# Patient Record
Sex: Female | Born: 1980 | Race: White | Hispanic: No | Marital: Single | Smoking: Former smoker
Health system: Southern US, Community
[De-identification: ages and names within clinical notes are randomized; demographics above are authoritative.]

## PROBLEM LIST (undated history)

## (undated) ENCOUNTER — Inpatient Hospital Stay (HOSPITAL_COMMUNITY): Payer: Self-pay

## (undated) DIAGNOSIS — F419 Anxiety disorder, unspecified: Secondary | ICD-10-CM

## (undated) DIAGNOSIS — R87619 Unspecified abnormal cytological findings in specimens from cervix uteri: Secondary | ICD-10-CM

## (undated) DIAGNOSIS — IMO0002 Reserved for concepts with insufficient information to code with codable children: Secondary | ICD-10-CM

## (undated) DIAGNOSIS — R51 Headache: Secondary | ICD-10-CM

## (undated) DIAGNOSIS — O139 Gestational [pregnancy-induced] hypertension without significant proteinuria, unspecified trimester: Secondary | ICD-10-CM

## (undated) DIAGNOSIS — R87629 Unspecified abnormal cytological findings in specimens from vagina: Secondary | ICD-10-CM

## (undated) HISTORY — PX: UPPER GASTROINTESTINAL ENDOSCOPY: SHX188

## (undated) HISTORY — DX: Unspecified abnormal cytological findings in specimens from vagina: R87.629

## (undated) HISTORY — PX: COLONOSCOPY: SHX174

## (undated) HISTORY — PX: OTHER SURGICAL HISTORY: SHX169

## (undated) HISTORY — PX: WISDOM TOOTH EXTRACTION: SHX21

## (undated) SURGERY — Surgical Case
Anesthesia: *Unknown

---
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
## 2002-06-04 ENCOUNTER — Emergency Department (HOSPITAL_COMMUNITY): Admission: EM | Admit: 2002-06-04 | Discharge: 2002-06-04 | Payer: Self-pay | Admitting: Emergency Medicine

## 2002-06-06 ENCOUNTER — Emergency Department (HOSPITAL_COMMUNITY): Admission: EM | Admit: 2002-06-06 | Discharge: 2002-06-06 | Payer: Self-pay | Admitting: Emergency Medicine

## 2002-09-09 HISTORY — PX: CHOLECYSTECTOMY: SHX55

## 2002-10-25 ENCOUNTER — Emergency Department (HOSPITAL_COMMUNITY): Admission: EM | Admit: 2002-10-25 | Discharge: 2002-10-25 | Payer: Self-pay | Admitting: Emergency Medicine

## 2002-10-26 ENCOUNTER — Encounter: Payer: Self-pay | Admitting: Emergency Medicine

## 2002-10-27 ENCOUNTER — Observation Stay (HOSPITAL_COMMUNITY): Admission: RE | Admit: 2002-10-27 | Discharge: 2002-10-28 | Payer: Self-pay | Admitting: General Surgery

## 2002-10-28 ENCOUNTER — Encounter (INDEPENDENT_AMBULATORY_CARE_PROVIDER_SITE_OTHER): Payer: Self-pay | Admitting: Specialist

## 2002-12-26 ENCOUNTER — Emergency Department (HOSPITAL_COMMUNITY): Admission: EM | Admit: 2002-12-26 | Discharge: 2002-12-26 | Payer: Self-pay

## 2003-02-25 ENCOUNTER — Other Ambulatory Visit: Admission: RE | Admit: 2003-02-25 | Discharge: 2003-02-25 | Payer: Self-pay | Admitting: Obstetrics and Gynecology

## 2004-02-28 ENCOUNTER — Other Ambulatory Visit: Admission: RE | Admit: 2004-02-28 | Discharge: 2004-02-28 | Payer: Self-pay | Admitting: Obstetrics and Gynecology

## 2005-03-12 ENCOUNTER — Emergency Department (HOSPITAL_COMMUNITY): Admission: EM | Admit: 2005-03-12 | Discharge: 2005-03-12 | Payer: Self-pay | Admitting: Emergency Medicine

## 2005-07-19 ENCOUNTER — Emergency Department (HOSPITAL_COMMUNITY): Admission: EM | Admit: 2005-07-19 | Discharge: 2005-07-20 | Payer: Self-pay | Admitting: Emergency Medicine

## 2005-10-14 ENCOUNTER — Other Ambulatory Visit: Admission: RE | Admit: 2005-10-14 | Discharge: 2005-10-14 | Payer: Self-pay | Admitting: Obstetrics and Gynecology

## 2006-01-26 ENCOUNTER — Emergency Department (HOSPITAL_COMMUNITY): Admission: EM | Admit: 2006-01-26 | Discharge: 2006-01-26 | Payer: Self-pay | Admitting: Emergency Medicine

## 2006-06-17 ENCOUNTER — Emergency Department (HOSPITAL_COMMUNITY): Admission: EM | Admit: 2006-06-17 | Discharge: 2006-06-17 | Payer: Self-pay | Admitting: Emergency Medicine

## 2006-12-08 ENCOUNTER — Emergency Department (HOSPITAL_COMMUNITY): Admission: EM | Admit: 2006-12-08 | Discharge: 2006-12-08 | Payer: Self-pay | Admitting: Emergency Medicine

## 2007-04-06 ENCOUNTER — Emergency Department (HOSPITAL_COMMUNITY): Admission: EM | Admit: 2007-04-06 | Discharge: 2007-04-06 | Payer: Self-pay | Admitting: Emergency Medicine

## 2007-05-26 ENCOUNTER — Emergency Department (HOSPITAL_COMMUNITY): Admission: EM | Admit: 2007-05-26 | Discharge: 2007-05-26 | Payer: Self-pay | Admitting: Emergency Medicine

## 2007-06-01 ENCOUNTER — Emergency Department (HOSPITAL_COMMUNITY): Admission: EM | Admit: 2007-06-01 | Discharge: 2007-06-01 | Payer: Self-pay | Admitting: Emergency Medicine

## 2007-06-23 ENCOUNTER — Emergency Department (HOSPITAL_COMMUNITY): Admission: EM | Admit: 2007-06-23 | Discharge: 2007-06-23 | Payer: Self-pay | Admitting: Emergency Medicine

## 2007-06-26 ENCOUNTER — Emergency Department (HOSPITAL_COMMUNITY): Admission: EM | Admit: 2007-06-26 | Discharge: 2007-06-26 | Payer: Self-pay | Admitting: Emergency Medicine

## 2007-07-13 ENCOUNTER — Emergency Department (HOSPITAL_COMMUNITY): Admission: EM | Admit: 2007-07-13 | Discharge: 2007-07-13 | Payer: Self-pay | Admitting: *Deleted

## 2007-07-27 ENCOUNTER — Emergency Department (HOSPITAL_COMMUNITY): Admission: EM | Admit: 2007-07-27 | Discharge: 2007-07-27 | Payer: Self-pay | Admitting: Emergency Medicine

## 2007-08-29 ENCOUNTER — Emergency Department (HOSPITAL_COMMUNITY): Admission: EM | Admit: 2007-08-29 | Discharge: 2007-08-29 | Payer: Self-pay | Admitting: Emergency Medicine

## 2007-09-02 ENCOUNTER — Emergency Department (HOSPITAL_COMMUNITY): Admission: EM | Admit: 2007-09-02 | Discharge: 2007-09-02 | Payer: Self-pay | Admitting: Emergency Medicine

## 2007-09-27 ENCOUNTER — Emergency Department (HOSPITAL_COMMUNITY): Admission: EM | Admit: 2007-09-27 | Discharge: 2007-09-27 | Payer: Self-pay | Admitting: Emergency Medicine

## 2007-10-13 ENCOUNTER — Emergency Department (HOSPITAL_COMMUNITY): Admission: EM | Admit: 2007-10-13 | Discharge: 2007-10-13 | Payer: Self-pay | Admitting: Emergency Medicine

## 2007-11-20 ENCOUNTER — Emergency Department (HOSPITAL_COMMUNITY): Admission: EM | Admit: 2007-11-20 | Discharge: 2007-11-20 | Payer: Self-pay | Admitting: Emergency Medicine

## 2008-01-14 ENCOUNTER — Emergency Department (HOSPITAL_COMMUNITY): Admission: EM | Admit: 2008-01-14 | Discharge: 2008-01-14 | Payer: Self-pay | Admitting: Emergency Medicine

## 2008-02-14 ENCOUNTER — Emergency Department (HOSPITAL_COMMUNITY): Admission: EM | Admit: 2008-02-14 | Discharge: 2008-02-15 | Payer: Self-pay | Admitting: Emergency Medicine

## 2008-04-04 ENCOUNTER — Inpatient Hospital Stay (HOSPITAL_COMMUNITY): Admission: AD | Admit: 2008-04-04 | Discharge: 2008-04-04 | Payer: Self-pay | Admitting: Obstetrics and Gynecology

## 2008-05-04 DIAGNOSIS — R87612 Low grade squamous intraepithelial lesion on cytologic smear of cervix (LGSIL): Secondary | ICD-10-CM | POA: Insufficient documentation

## 2008-05-12 ENCOUNTER — Emergency Department (HOSPITAL_COMMUNITY): Admission: EM | Admit: 2008-05-12 | Discharge: 2008-05-12 | Payer: Self-pay | Admitting: Emergency Medicine

## 2008-06-21 ENCOUNTER — Ambulatory Visit: Admission: RE | Admit: 2008-06-21 | Discharge: 2008-06-21 | Payer: Self-pay | Admitting: Gynecologic Oncology

## 2008-07-06 ENCOUNTER — Inpatient Hospital Stay (HOSPITAL_COMMUNITY): Admission: AD | Admit: 2008-07-06 | Discharge: 2008-07-07 | Payer: Self-pay | Admitting: Obstetrics and Gynecology

## 2008-07-15 ENCOUNTER — Inpatient Hospital Stay (HOSPITAL_COMMUNITY): Admission: AD | Admit: 2008-07-15 | Discharge: 2008-07-15 | Payer: Self-pay | Admitting: Obstetrics and Gynecology

## 2008-07-19 ENCOUNTER — Encounter: Admission: RE | Admit: 2008-07-19 | Discharge: 2008-07-19 | Payer: Self-pay | Admitting: Obstetrics and Gynecology

## 2008-08-05 ENCOUNTER — Inpatient Hospital Stay (HOSPITAL_COMMUNITY): Admission: AD | Admit: 2008-08-05 | Discharge: 2008-08-05 | Payer: Self-pay | Admitting: Obstetrics and Gynecology

## 2008-08-22 ENCOUNTER — Inpatient Hospital Stay (HOSPITAL_COMMUNITY): Admission: AD | Admit: 2008-08-22 | Discharge: 2008-08-22 | Payer: Self-pay | Admitting: Obstetrics and Gynecology

## 2008-09-19 ENCOUNTER — Inpatient Hospital Stay (HOSPITAL_COMMUNITY): Admission: AD | Admit: 2008-09-19 | Discharge: 2008-09-20 | Payer: Self-pay | Admitting: Obstetrics and Gynecology

## 2008-10-07 ENCOUNTER — Inpatient Hospital Stay (HOSPITAL_COMMUNITY): Admission: AD | Admit: 2008-10-07 | Discharge: 2008-10-07 | Payer: Self-pay | Admitting: Obstetrics and Gynecology

## 2008-10-18 ENCOUNTER — Inpatient Hospital Stay (HOSPITAL_COMMUNITY): Admission: AD | Admit: 2008-10-18 | Discharge: 2008-10-18 | Payer: Self-pay | Admitting: Obstetrics and Gynecology

## 2008-10-22 ENCOUNTER — Inpatient Hospital Stay (HOSPITAL_COMMUNITY): Admission: AD | Admit: 2008-10-22 | Discharge: 2008-10-22 | Payer: Self-pay | Admitting: Obstetrics and Gynecology

## 2008-10-27 ENCOUNTER — Inpatient Hospital Stay (HOSPITAL_COMMUNITY): Admission: AD | Admit: 2008-10-27 | Discharge: 2008-10-27 | Payer: Self-pay | Admitting: Obstetrics and Gynecology

## 2008-10-29 ENCOUNTER — Inpatient Hospital Stay (HOSPITAL_COMMUNITY): Admission: AD | Admit: 2008-10-29 | Discharge: 2008-10-29 | Payer: Self-pay | Admitting: Obstetrics and Gynecology

## 2008-10-31 ENCOUNTER — Inpatient Hospital Stay (HOSPITAL_COMMUNITY): Admission: AD | Admit: 2008-10-31 | Discharge: 2008-10-31 | Payer: Self-pay | Admitting: Obstetrics and Gynecology

## 2008-11-05 ENCOUNTER — Inpatient Hospital Stay (HOSPITAL_COMMUNITY): Admission: AD | Admit: 2008-11-05 | Discharge: 2008-11-05 | Payer: Self-pay | Admitting: Obstetrics and Gynecology

## 2008-11-10 ENCOUNTER — Inpatient Hospital Stay (HOSPITAL_COMMUNITY): Admission: AD | Admit: 2008-11-10 | Discharge: 2008-11-10 | Payer: Self-pay | Admitting: Obstetrics and Gynecology

## 2008-11-12 ENCOUNTER — Inpatient Hospital Stay (HOSPITAL_COMMUNITY): Admission: AD | Admit: 2008-11-12 | Discharge: 2008-11-13 | Payer: Self-pay | Admitting: Obstetrics and Gynecology

## 2008-11-14 ENCOUNTER — Inpatient Hospital Stay (HOSPITAL_COMMUNITY): Admission: AD | Admit: 2008-11-14 | Discharge: 2008-11-17 | Payer: Self-pay | Admitting: Obstetrics and Gynecology

## 2009-02-20 ENCOUNTER — Emergency Department (HOSPITAL_COMMUNITY): Admission: EM | Admit: 2009-02-20 | Discharge: 2009-02-20 | Payer: Self-pay | Admitting: Emergency Medicine

## 2009-04-09 ENCOUNTER — Emergency Department (HOSPITAL_COMMUNITY): Admission: EM | Admit: 2009-04-09 | Discharge: 2009-04-09 | Payer: Self-pay | Admitting: Emergency Medicine

## 2009-05-22 ENCOUNTER — Emergency Department (HOSPITAL_COMMUNITY): Admission: EM | Admit: 2009-05-22 | Discharge: 2009-05-23 | Payer: Self-pay | Admitting: Emergency Medicine

## 2009-06-11 ENCOUNTER — Emergency Department (HOSPITAL_COMMUNITY): Admission: EM | Admit: 2009-06-11 | Discharge: 2009-06-11 | Payer: Self-pay | Admitting: Emergency Medicine

## 2009-07-03 ENCOUNTER — Encounter: Admission: RE | Admit: 2009-07-03 | Discharge: 2009-07-03 | Payer: Self-pay | Admitting: Family Medicine

## 2009-10-28 IMAGING — CR DG FOOT COMPLETE 3+V*L*
3 series · 3 of 3 positions shown · non-contrast
Comparison: 03/12/2005

CLINICAL DATA: Injury today.  Chronic pain.  Pain centered at the
first metatarsal.

LEFT FOOT - COMPLETE 3+ VIEW

[t foot ap left]
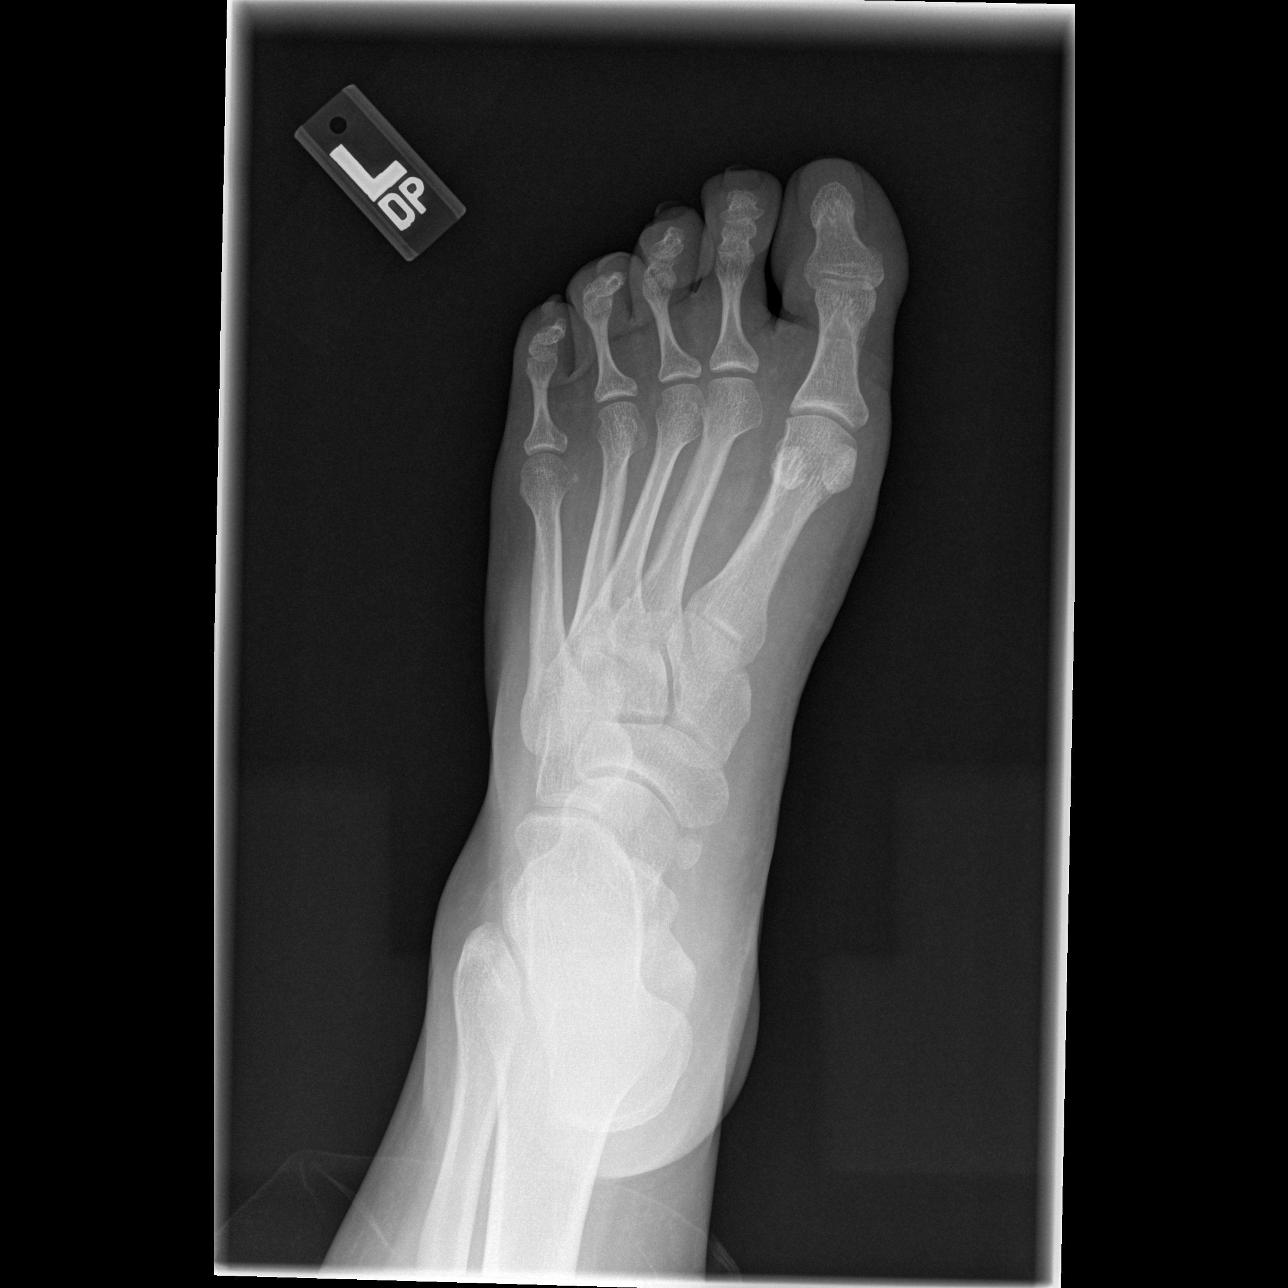

[t foot oblique left]
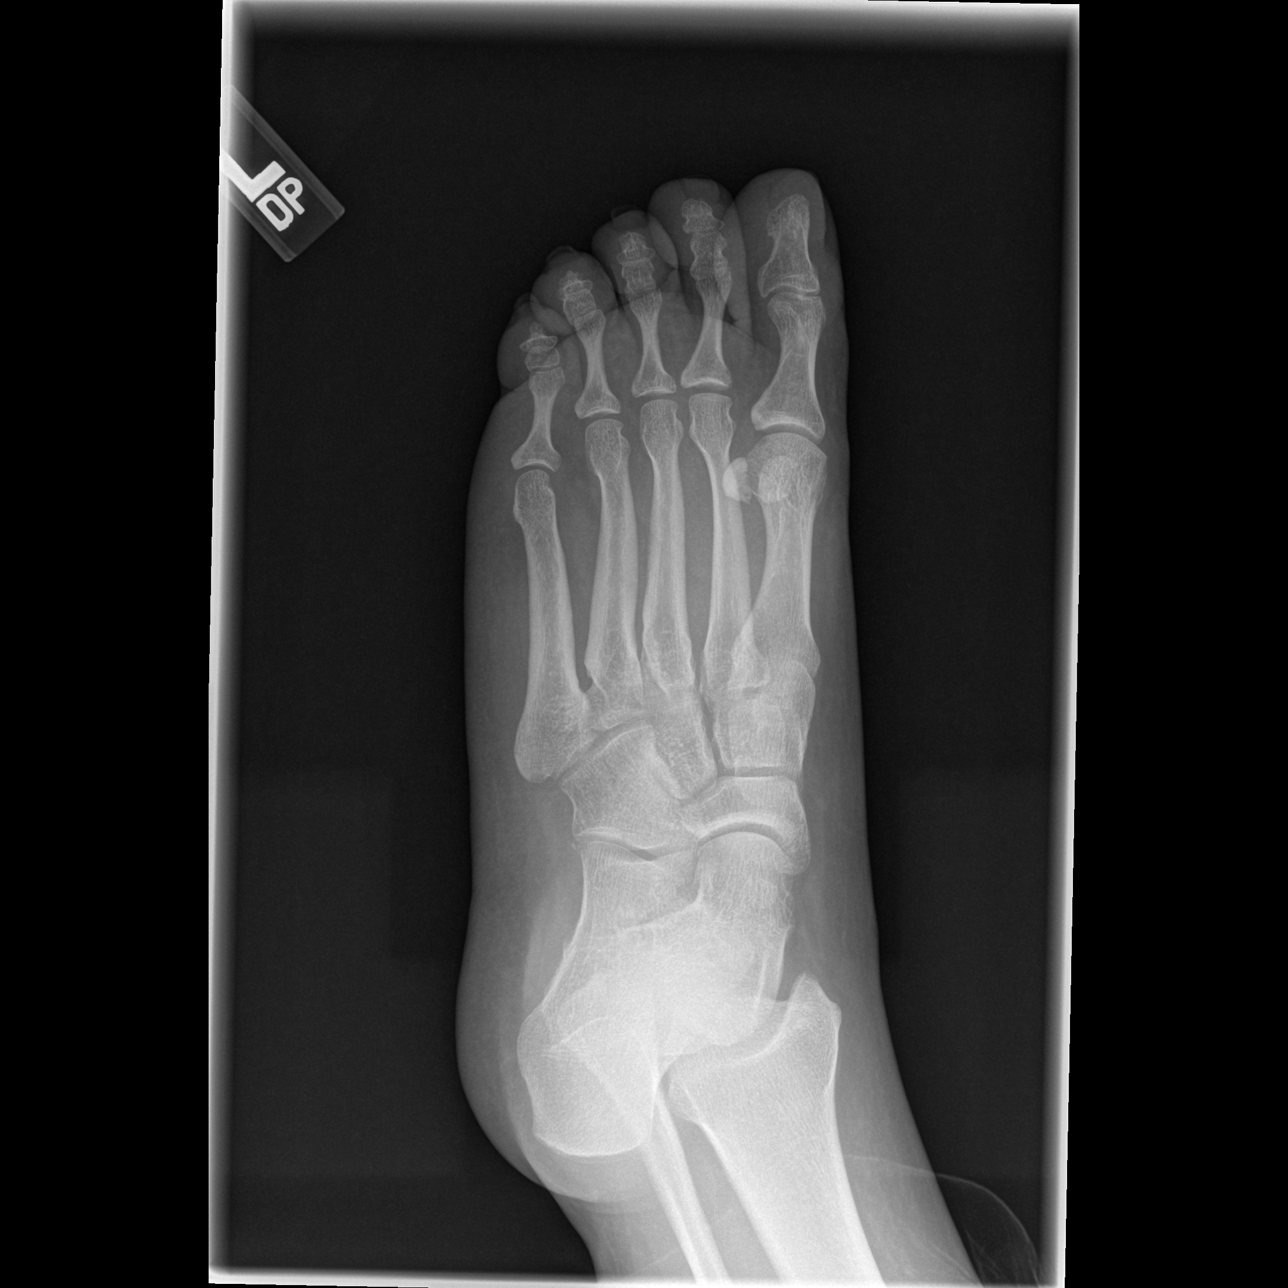

[t foot lat left]
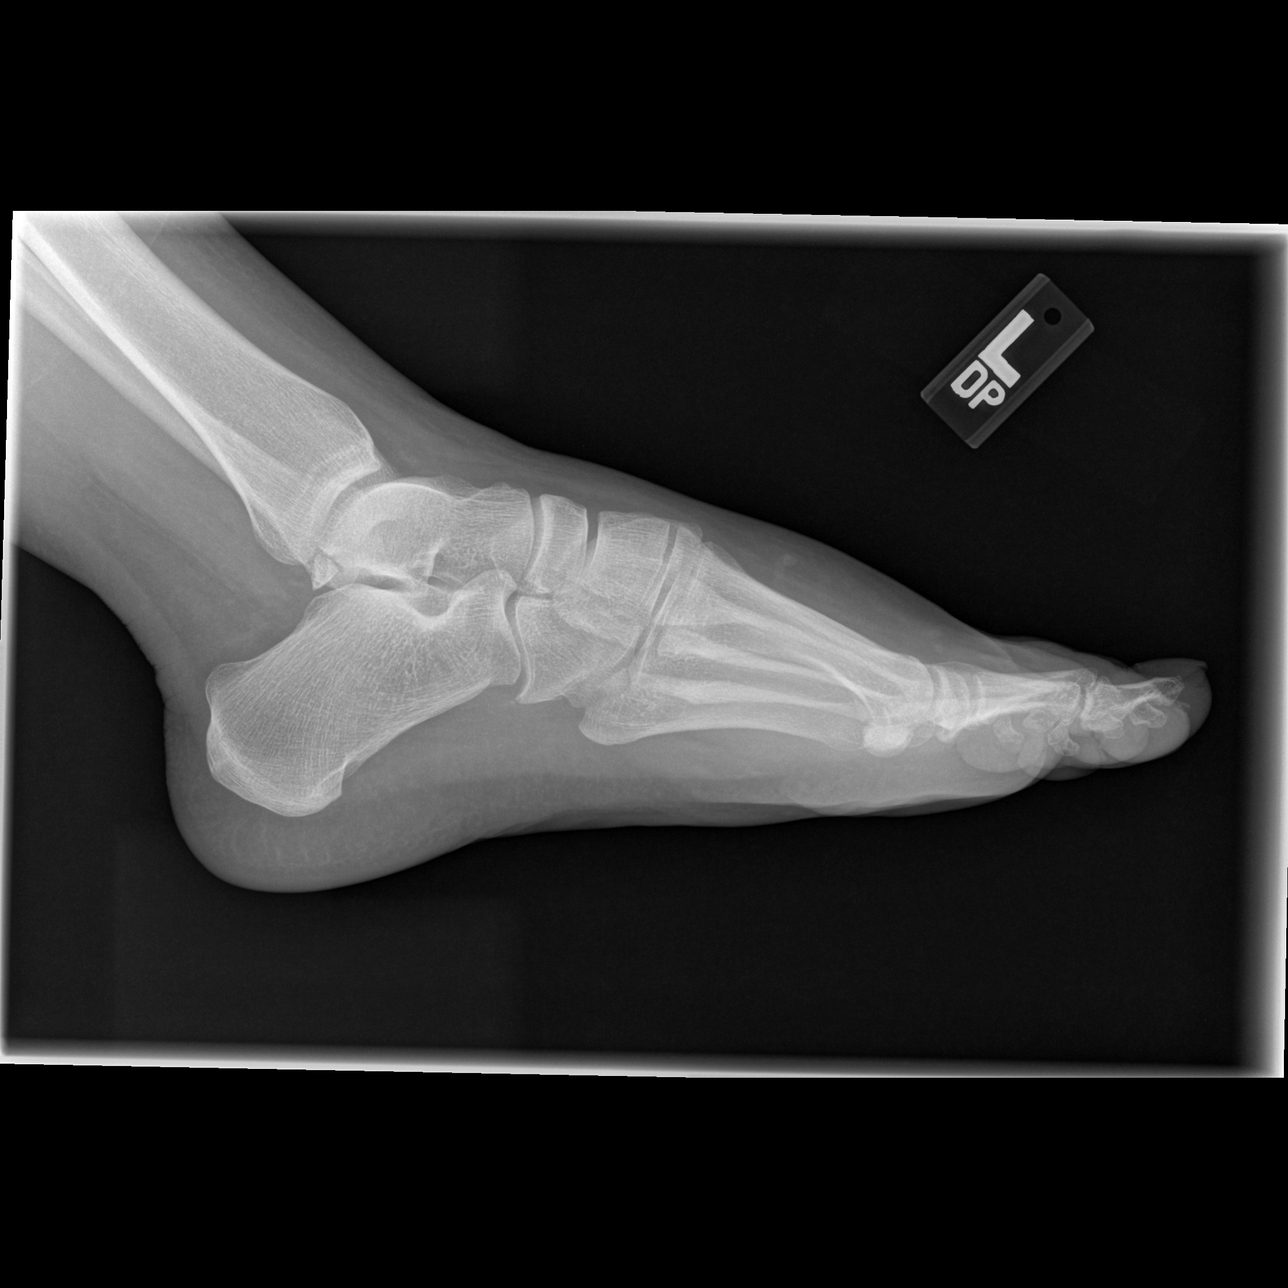

[3 of 3 positions shown; findings below may reference images not displayed]

FINDINGS: Mild dorsal forefoot soft tissue swelling suspected. No
acute fracture or dislocation.
IMPRESSION: 1. No acute fracture or dislocation about the left foot
2.  Soft tissue swelling dorsally about the midfoot and forefoot.

## 2009-11-21 ENCOUNTER — Emergency Department (HOSPITAL_COMMUNITY): Admission: EM | Admit: 2009-11-21 | Discharge: 2009-11-21 | Payer: Self-pay | Admitting: Emergency Medicine

## 2009-11-23 ENCOUNTER — Emergency Department (HOSPITAL_COMMUNITY): Admission: EM | Admit: 2009-11-23 | Discharge: 2009-11-23 | Payer: Self-pay | Admitting: Family Medicine

## 2009-11-24 ENCOUNTER — Encounter: Admission: RE | Admit: 2009-11-24 | Discharge: 2009-11-24 | Payer: Self-pay | Admitting: Family Medicine

## 2009-12-10 ENCOUNTER — Emergency Department (HOSPITAL_COMMUNITY): Admission: EM | Admit: 2009-12-10 | Discharge: 2009-12-10 | Payer: Self-pay | Admitting: Emergency Medicine

## 2009-12-27 ENCOUNTER — Encounter: Admission: RE | Admit: 2009-12-27 | Discharge: 2009-12-27 | Payer: Self-pay | Admitting: Family Medicine

## 2009-12-29 ENCOUNTER — Emergency Department (HOSPITAL_COMMUNITY): Admission: EM | Admit: 2009-12-29 | Discharge: 2009-12-30 | Payer: Self-pay | Admitting: Emergency Medicine

## 2010-01-11 ENCOUNTER — Emergency Department (HOSPITAL_COMMUNITY): Admission: EM | Admit: 2010-01-11 | Discharge: 2010-01-12 | Payer: Self-pay | Admitting: Emergency Medicine

## 2010-01-17 ENCOUNTER — Emergency Department (HOSPITAL_COMMUNITY): Admission: EM | Admit: 2010-01-17 | Discharge: 2010-01-17 | Payer: Self-pay | Admitting: Emergency Medicine

## 2010-01-22 ENCOUNTER — Ambulatory Visit: Payer: Self-pay | Admitting: Urology

## 2010-01-28 ENCOUNTER — Emergency Department (HOSPITAL_COMMUNITY): Admission: EM | Admit: 2010-01-28 | Discharge: 2010-01-28 | Payer: Self-pay | Admitting: Family Medicine

## 2010-03-31 ENCOUNTER — Emergency Department (HOSPITAL_COMMUNITY): Admission: EM | Admit: 2010-03-31 | Discharge: 2010-03-31 | Payer: Self-pay | Admitting: Emergency Medicine

## 2010-05-09 IMAGING — CT CT ABD-PELV W/O CM
3 series · 13 of 25 positions shown, 19 images · non-contrast
Comparison: 12/27/2009.

CLINICAL DATA: 29-year-old female with low pelvic and back pain.

CT ABDOMEN AND PELVIS WITHOUT CONTRAST
TECHNIQUE: Multidetector CT imaging of the abdomen and pelvis was
performed following the standard protocol without intravenous
contrast.

[Series 4: lung windows · axial · 0.71mm/px · z∈[+57,+97]mm · 9 of 11 slices shown, 15 images]
[im 2/11  soft-tissue]
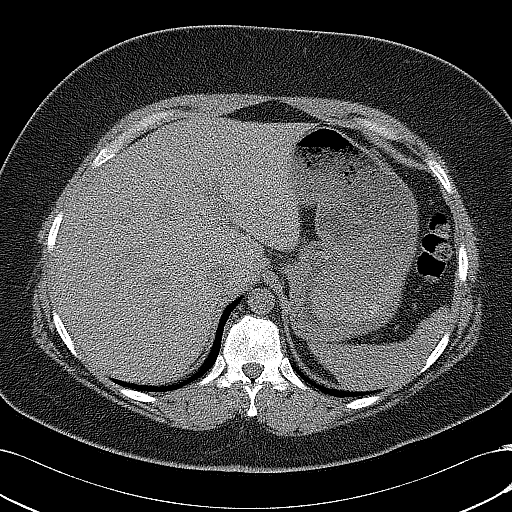
[im 2/11  bone]
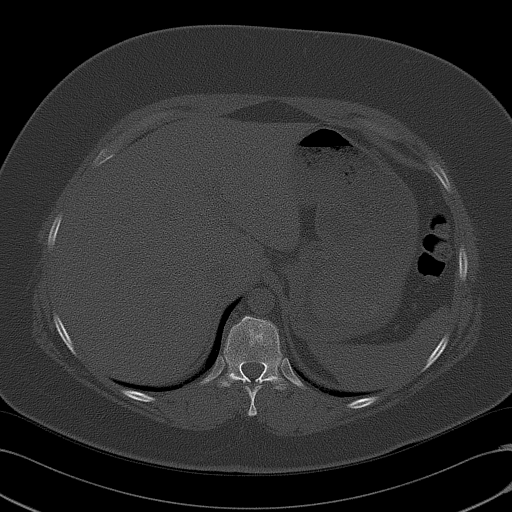
[im 3/11  soft-tissue]
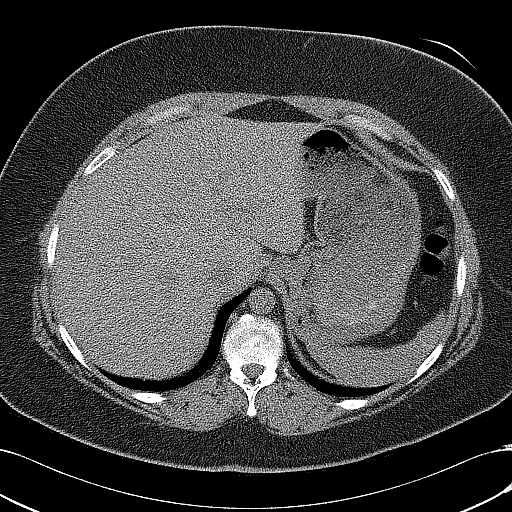
[im 4/11  soft-tissue]
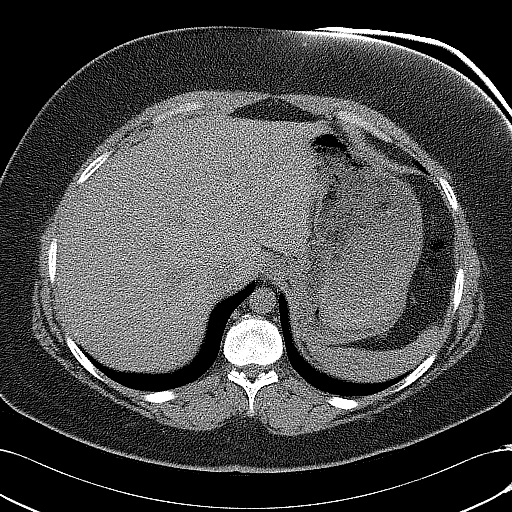
[im 5/11  soft-tissue]
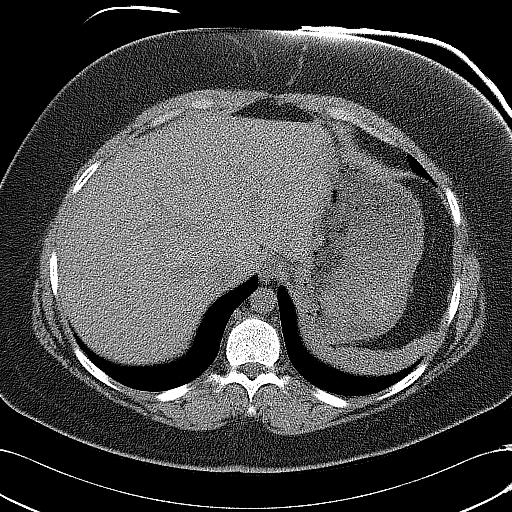
[im 6/11  soft-tissue]
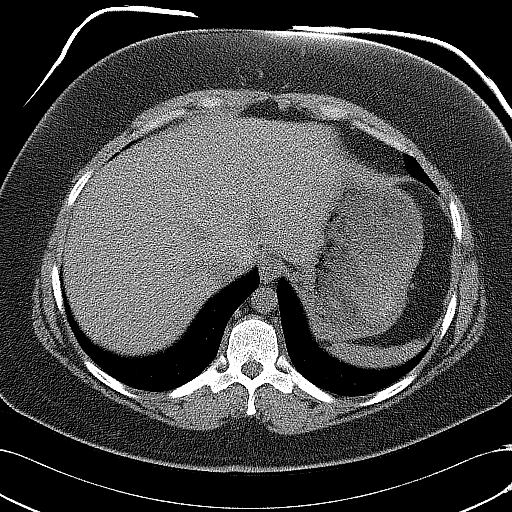
[im 7/11  soft-tissue]
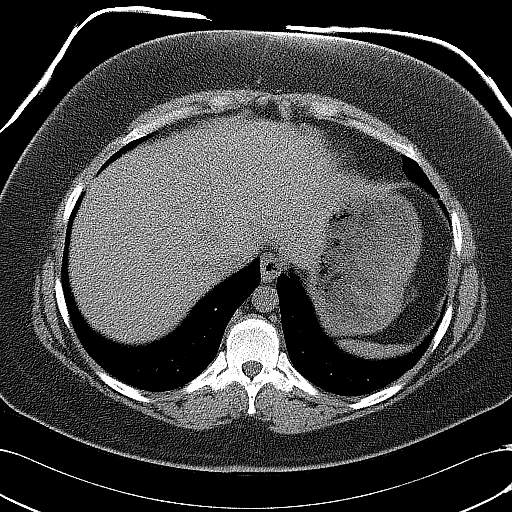
[im 7/11  lung]
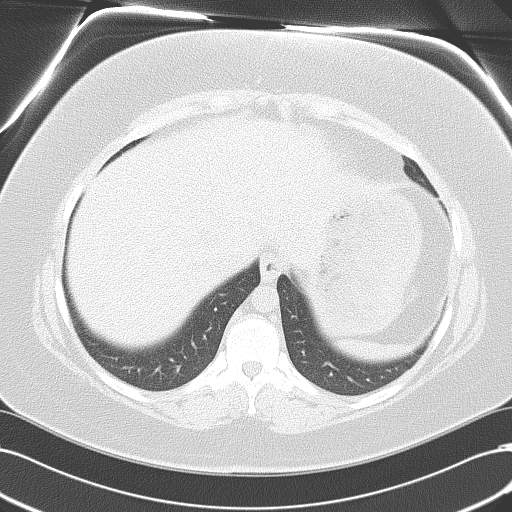
[im 8/11  soft-tissue]
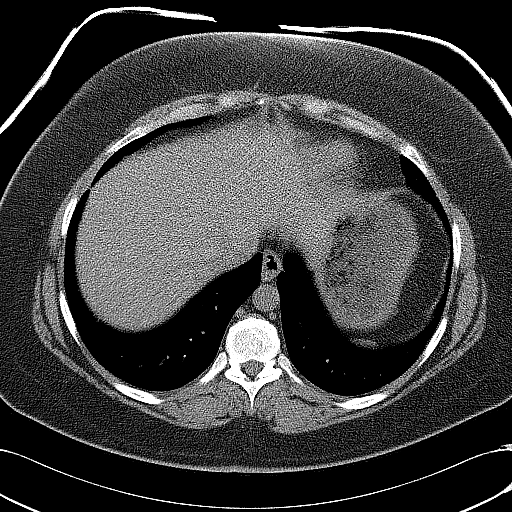
[im 8/11  lung]
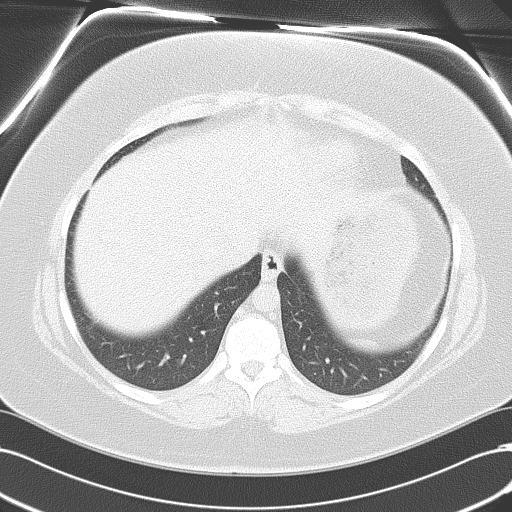
[im 9/11  soft-tissue]
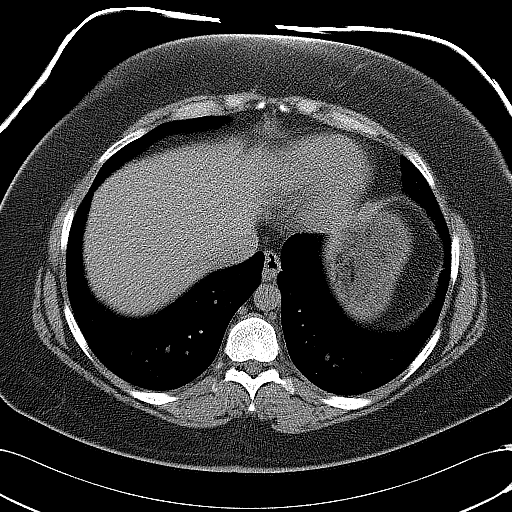
[im 9/11  lung]
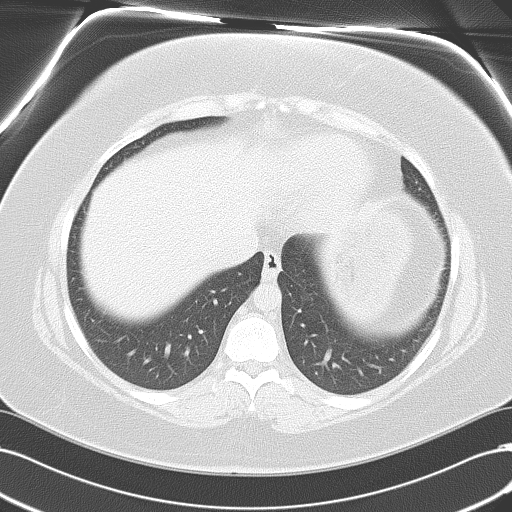
[im 10/11  soft-tissue]
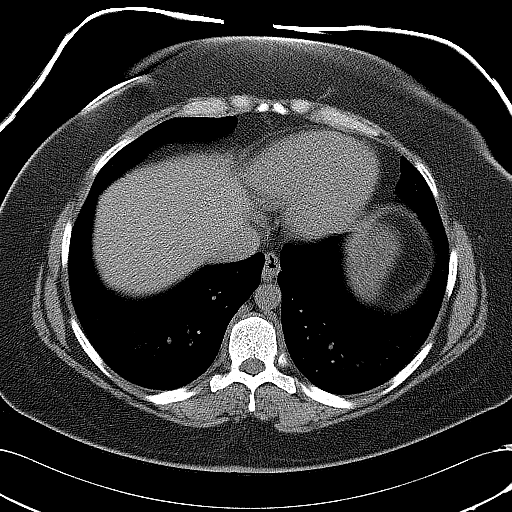
[im 10/11  lung]
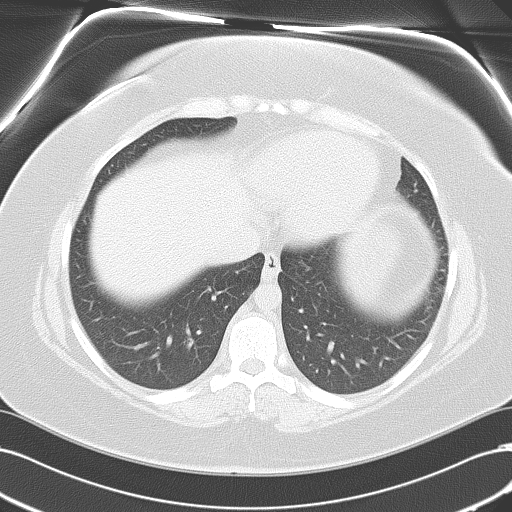
[im 10/11  bone]
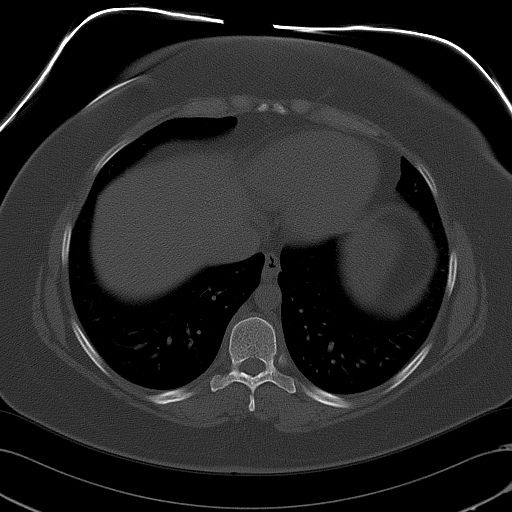

[Series 602: <mpr thick range> · coronal · 0.80mm/px · 3 of 86 slices shown]
[im 29/86  soft-tissue]
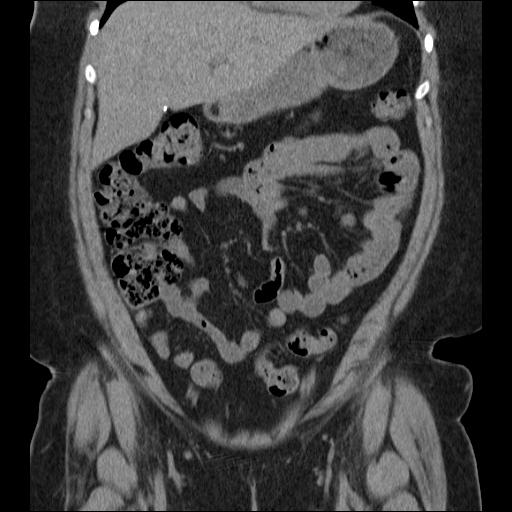
[im 38/86  soft-tissue]
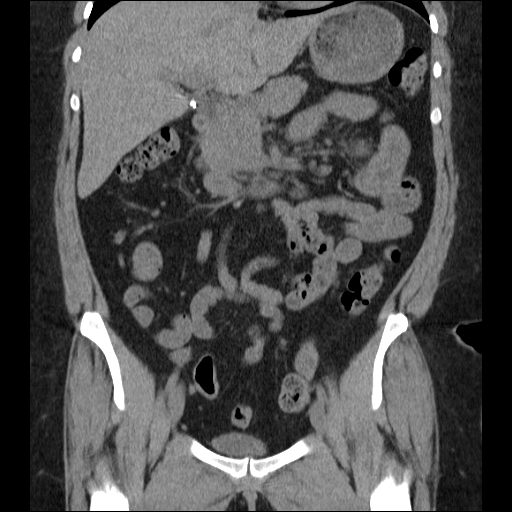
[im 48/86  soft-tissue]
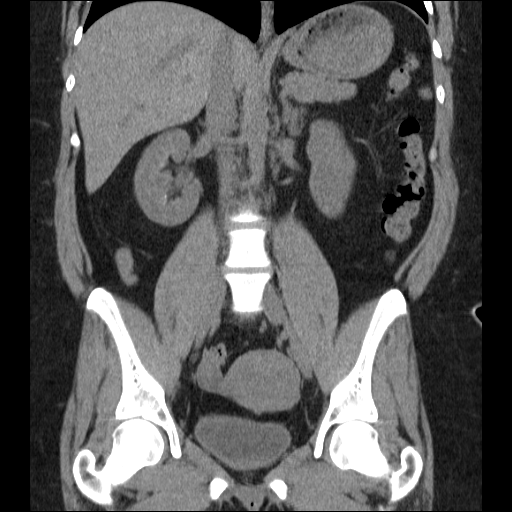

[Series 603: <mpr thick range(1)> · sagittal · 0.80mm/px · 1 of 106 slices shown]
[im 36/106  soft-tissue]
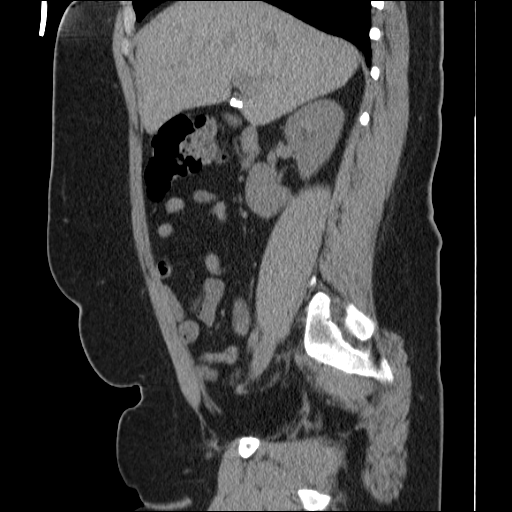

[13 of 25 positions shown; findings below may reference images not displayed]

FINDINGS: Lung bases are clear.  Gallbladder surgically absent. No
acute osseous abnormality identified.  Normal appendix.  No dilated
bowel.  Stomach is mildly distended with food.  Duodenum is within
normal limits.

Decreased size of small bowel mesenteric lymph nodes described on
previous exam.  No free fluid.  Noncontrast liver, spleen, pancreas
and adrenal glands are stable and within normal limits (small
calcified splenic granuloma unchanged).

Punctate bilateral nephrolithiasis re-identified.  No
hydronephrosis or hydroureter.  The bladder is unremarkable.
Noncontrast uterus and adnexa are within normal limits.  No focal
inflammatory stranding.
IMPRESSION: 1.  Interval decreased size and number of mesenteric lymph nodes
may reflect physiologic variation or resolving mesenteric adenitis.
2.  Bilateral punctate nephrolithiasis.  No obstructive uropathy or
acute findings.  Normal appendix.

## 2010-06-13 ENCOUNTER — Emergency Department (HOSPITAL_COMMUNITY): Admission: EM | Admit: 2010-06-13 | Discharge: 2010-06-13 | Payer: Self-pay | Admitting: Emergency Medicine

## 2010-07-05 ENCOUNTER — Encounter: Admission: RE | Admit: 2010-07-05 | Discharge: 2010-07-05 | Payer: Self-pay | Admitting: Specialist

## 2010-07-06 ENCOUNTER — Encounter: Admission: RE | Admit: 2010-07-06 | Payer: Self-pay | Admitting: Gastroenterology

## 2010-07-21 ENCOUNTER — Emergency Department (HOSPITAL_COMMUNITY): Admission: EM | Admit: 2010-07-21 | Discharge: 2010-07-21 | Payer: Self-pay | Admitting: Emergency Medicine

## 2010-08-03 ENCOUNTER — Emergency Department (HOSPITAL_COMMUNITY): Admission: EM | Admit: 2010-08-03 | Discharge: 2010-08-03 | Payer: Self-pay | Admitting: Emergency Medicine

## 2010-08-12 ENCOUNTER — Emergency Department (HOSPITAL_COMMUNITY)
Admission: EM | Admit: 2010-08-12 | Discharge: 2010-08-12 | Payer: Self-pay | Source: Home / Self Care | Admitting: Emergency Medicine

## 2010-09-12 ENCOUNTER — Encounter
Admission: RE | Admit: 2010-09-12 | Discharge: 2010-10-09 | Payer: Self-pay | Source: Home / Self Care | Attending: Orthopedic Surgery | Admitting: Orthopedic Surgery

## 2010-09-19 ENCOUNTER — Emergency Department (HOSPITAL_COMMUNITY)
Admission: EM | Admit: 2010-09-19 | Discharge: 2010-09-19 | Payer: Self-pay | Source: Home / Self Care | Admitting: Emergency Medicine

## 2010-09-29 ENCOUNTER — Encounter: Payer: Self-pay | Admitting: Gastroenterology

## 2010-10-10 ENCOUNTER — Ambulatory Visit: Payer: Self-pay | Admitting: Physical Therapy

## 2010-11-26 LAB — POCT URINALYSIS DIP (DEVICE)
Glucose, UA: NEGATIVE mg/dL
Ketones, ur: NEGATIVE mg/dL
Specific Gravity, Urine: 1.02 (ref 1.005–1.030)
Urobilinogen, UA: 0.2 mg/dL (ref 0.0–1.0)

## 2010-11-26 LAB — POCT PREGNANCY, URINE: Preg Test, Ur: NEGATIVE

## 2010-11-27 LAB — URINE CULTURE: Colony Count: 15000

## 2010-11-27 LAB — URINE MICROSCOPIC-ADD ON

## 2010-11-27 LAB — URINALYSIS, ROUTINE W REFLEX MICROSCOPIC
Bilirubin Urine: NEGATIVE
Ketones, ur: NEGATIVE mg/dL
Ketones, ur: NEGATIVE mg/dL
Nitrite: NEGATIVE
Nitrite: NEGATIVE
Specific Gravity, Urine: 1.019 (ref 1.005–1.030)
Urobilinogen, UA: 0.2 mg/dL (ref 0.0–1.0)
Urobilinogen, UA: 0.2 mg/dL (ref 0.0–1.0)
pH: 6.5 (ref 5.0–8.0)
pH: 7 (ref 5.0–8.0)

## 2010-11-27 LAB — COMPREHENSIVE METABOLIC PANEL
BUN: 10 mg/dL (ref 6–23)
CO2: 31 mEq/L (ref 19–32)
Calcium: 9.1 mg/dL (ref 8.4–10.5)
Chloride: 105 mEq/L (ref 96–112)
Creatinine, Ser: 0.58 mg/dL (ref 0.4–1.2)
GFR calc non Af Amer: >60 mL/min (ref >60)
Glucose, Bld: 119 mg/dL — ABNORMAL HIGH (ref 70–99)
Total Bilirubin: 0.6 mg/dL (ref 0.3–1.2)

## 2010-11-27 LAB — PREGNANCY, URINE
Preg Test, Ur: NEGATIVE
Preg Test, Ur: NEGATIVE

## 2010-11-27 LAB — DIFFERENTIAL
Basophils Absolute: 0.1 10*3/uL (ref 0.0–0.1)
Eosinophils Relative: 3 % (ref 0–5)
Lymphocytes Relative: 38 % (ref 12–46)
Lymphs Abs: 4.5 10*3/uL — ABNORMAL HIGH (ref 0.7–4.0)
Neutrophils Relative %: 51 % (ref 43–77)

## 2010-11-27 LAB — CBC
HCT: 42.8 % (ref 36.0–46.0)
Hemoglobin: 14.1 g/dL (ref 12.0–15.0)
MCHC: 33 g/dL (ref 30.0–36.0)
MCV: 96.6 fL (ref 78.0–100.0)
RBC: 4.43 MIL/uL (ref 3.87–5.11)
WBC: 11.9 10*3/uL — ABNORMAL HIGH (ref 4.0–10.5)

## 2010-11-27 LAB — LIPASE, BLOOD: Lipase: 50 U/L (ref 11–59)

## 2010-12-14 LAB — DIFFERENTIAL
Basophils Absolute: 0 10*3/uL (ref 0.0–0.1)
Eosinophils Absolute: 0.4 10*3/uL (ref 0.0–0.7)
Eosinophils Relative: 3 % (ref 0–5)
Lymphocytes Relative: 36 % (ref 12–46)
Monocytes Absolute: 0.6 10*3/uL (ref 0.1–1.0)

## 2010-12-14 LAB — CBC
HCT: 42.9 % (ref 36.0–46.0)
Hemoglobin: 14.6 g/dL (ref 12.0–15.0)
MCHC: 34.1 g/dL (ref 30.0–36.0)
MCV: 93.8 fL (ref 78.0–100.0)
Platelets: 240 10*3/uL (ref 150–400)
RDW: 12.3 % (ref 11.5–15.5)

## 2010-12-14 LAB — URINALYSIS, ROUTINE W REFLEX MICROSCOPIC
Bilirubin Urine: NEGATIVE
Hgb urine dipstick: NEGATIVE
Nitrite: NEGATIVE
Protein, ur: NEGATIVE mg/dL
Urobilinogen, UA: 0.2 mg/dL (ref 0.0–1.0)

## 2010-12-14 LAB — BASIC METABOLIC PANEL
BUN: 9 mg/dL (ref 6–23)
CO2: 24 mEq/L (ref 19–32)
Chloride: 105 mEq/L (ref 96–112)
Glucose, Bld: 105 mg/dL — ABNORMAL HIGH (ref 70–99)
Potassium: 3.2 mEq/L — ABNORMAL LOW (ref 3.5–5.1)
Sodium: 137 mEq/L (ref 135–145)

## 2010-12-14 LAB — PREGNANCY, URINE: Preg Test, Ur: NEGATIVE

## 2010-12-20 LAB — URINALYSIS, ROUTINE W REFLEX MICROSCOPIC
Bilirubin Urine: NEGATIVE
Glucose, UA: NEGATIVE mg/dL
Hgb urine dipstick: NEGATIVE
Ketones, ur: NEGATIVE mg/dL
Nitrite: NEGATIVE
Protein, ur: NEGATIVE mg/dL
Protein, ur: NEGATIVE mg/dL
Urobilinogen, UA: 0.2 mg/dL (ref 0.0–1.0)

## 2010-12-20 LAB — GLUCOSE, CAPILLARY
Glucose-Capillary: 108 mg/dL — ABNORMAL HIGH (ref 70–99)
Glucose-Capillary: 82 mg/dL (ref 70–99)

## 2010-12-20 LAB — CBC
HCT: 29.1 % — ABNORMAL LOW (ref 36.0–46.0)
Hemoglobin: 9.5 g/dL — ABNORMAL LOW (ref 12.0–15.0)
Hemoglobin: 9.6 g/dL — ABNORMAL LOW (ref 12.0–15.0)
MCHC: 32.8 g/dL (ref 30.0–36.0)
MCHC: 32.9 g/dL (ref 30.0–36.0)
MCHC: 33 g/dL (ref 30.0–36.0)
MCV: 88.4 fL (ref 78.0–100.0)
MCV: 89.7 fL (ref 78.0–100.0)
Platelets: 181 10*3/uL (ref 150–400)
Platelets: 197 10*3/uL (ref 150–400)
RBC: 3.22 MIL/uL — ABNORMAL LOW (ref 3.87–5.11)
RBC: 3.24 MIL/uL — ABNORMAL LOW (ref 3.87–5.11)
RBC: 3.32 MIL/uL — ABNORMAL LOW (ref 3.87–5.11)
RDW: 14.1 % (ref 11.5–15.5)
WBC: 15.6 10*3/uL — ABNORMAL HIGH (ref 4.0–10.5)
WBC: 8.2 10*3/uL (ref 4.0–10.5)

## 2010-12-20 LAB — DIFFERENTIAL
Lymphocytes Relative: 17 % (ref 12–46)
Lymphs Abs: 2.1 10*3/uL (ref 0.7–4.0)
Neutro Abs: 9.4 10*3/uL — ABNORMAL HIGH (ref 1.7–7.7)
Neutrophils Relative %: 75 % (ref 43–77)

## 2010-12-20 LAB — URIC ACID
Uric Acid, Serum: 3.2 mg/dL (ref 2.4–7.0)
Uric Acid, Serum: 3.3 mg/dL (ref 2.4–7.0)

## 2010-12-20 LAB — COMPREHENSIVE METABOLIC PANEL
ALT: 28 U/L (ref 0–35)
ALT: 35 U/L (ref 0–35)
AST: 31 U/L (ref 0–37)
Albumin: 2.2 g/dL — ABNORMAL LOW (ref 3.5–5.2)
Alkaline Phosphatase: 103 U/L (ref 39–117)
Alkaline Phosphatase: 120 U/L — ABNORMAL HIGH (ref 39–117)
BUN: 1 mg/dL — ABNORMAL LOW (ref 6–23)
BUN: 2 mg/dL — ABNORMAL LOW (ref 6–23)
BUN: 2 mg/dL — ABNORMAL LOW (ref 6–23)
CO2: 23 mEq/L (ref 19–32)
CO2: 25 mEq/L (ref 19–32)
CO2: 26 mEq/L (ref 19–32)
Calcium: 8 mg/dL — ABNORMAL LOW (ref 8.4–10.5)
Calcium: 8.1 mg/dL — ABNORMAL LOW (ref 8.4–10.5)
Chloride: 100 mEq/L (ref 96–112)
Chloride: 107 mEq/L (ref 96–112)
Creatinine, Ser: 0.33 mg/dL — ABNORMAL LOW (ref 0.4–1.2)
Creatinine, Ser: 0.35 mg/dL — ABNORMAL LOW (ref 0.4–1.2)
GFR calc Af Amer: >60 mL/min (ref >60)
GFR calc non Af Amer: >60 mL/min (ref >60)
GFR calc non Af Amer: >60 mL/min (ref >60)
GFR calc non Af Amer: >60 mL/min (ref >60)
Glucose, Bld: 85 mg/dL (ref 70–99)
Glucose, Bld: 89 mg/dL (ref 70–99)
Potassium: 3 mEq/L — ABNORMAL LOW (ref 3.5–5.1)
Sodium: 134 mEq/L — ABNORMAL LOW (ref 135–145)
Sodium: 139 mEq/L (ref 135–145)
Total Bilirubin: 0.3 mg/dL (ref 0.3–1.2)
Total Bilirubin: 0.6 mg/dL (ref 0.3–1.2)
Total Protein: 4.7 g/dL — ABNORMAL LOW (ref 6.0–8.3)

## 2010-12-20 LAB — LACTATE DEHYDROGENASE
LDH: 125 U/L (ref 94–250)
LDH: 137 U/L (ref 94–250)

## 2010-12-20 LAB — ELECTROLYTE PANEL
CO2: 28 mEq/L (ref 19–32)
Chloride: 102 mEq/L (ref 96–112)

## 2010-12-24 LAB — URINE MICROSCOPIC-ADD ON

## 2010-12-24 LAB — URINALYSIS, ROUTINE W REFLEX MICROSCOPIC
Leukocytes, UA: NEGATIVE
Nitrite: NEGATIVE
Specific Gravity, Urine: 1.015 (ref 1.005–1.030)
Urobilinogen, UA: 0.2 mg/dL (ref 0.0–1.0)
pH: 7 (ref 5.0–8.0)

## 2010-12-24 LAB — URINE CULTURE: Colony Count: 6000

## 2010-12-24 LAB — FETAL FIBRONECTIN
Fetal Fibronectin: NEGATIVE
Fetal Fibronectin: NEGATIVE

## 2010-12-24 LAB — GC/CHLAMYDIA PROBE AMP, GENITAL: GC Probe Amp, Genital: NEGATIVE

## 2010-12-24 LAB — WET PREP, GENITAL
Clue Cells Wet Prep HPF POC: NONE SEEN
Yeast Wet Prep HPF POC: NONE SEEN

## 2010-12-25 LAB — CBC
HCT: 31.5 % — ABNORMAL LOW (ref 36.0–46.0)
Hemoglobin: 10.5 g/dL — ABNORMAL LOW (ref 12.0–15.0)
RBC: 3.38 MIL/uL — ABNORMAL LOW (ref 3.87–5.11)
RDW: 13 % (ref 11.5–15.5)
WBC: 10.8 10*3/uL — ABNORMAL HIGH (ref 4.0–10.5)

## 2010-12-25 LAB — GLUCOSE, CAPILLARY: Glucose-Capillary: 117 mg/dL — ABNORMAL HIGH (ref 70–99)

## 2010-12-25 LAB — URINALYSIS, ROUTINE W REFLEX MICROSCOPIC
Bilirubin Urine: NEGATIVE
Bilirubin Urine: NEGATIVE
Bilirubin Urine: NEGATIVE
Glucose, UA: 100 mg/dL — AB
Ketones, ur: 15 mg/dL — AB
Ketones, ur: NEGATIVE mg/dL
Nitrite: NEGATIVE
Nitrite: NEGATIVE
Nitrite: NEGATIVE
Specific Gravity, Urine: 1.01 (ref 1.005–1.030)
Specific Gravity, Urine: <1.005 — ABNORMAL LOW (ref 1.005–1.030)
Urobilinogen, UA: 0.2 mg/dL (ref 0.0–1.0)
Urobilinogen, UA: 0.2 mg/dL (ref 0.0–1.0)
pH: 6.5 (ref 5.0–8.0)
pH: 7.5 (ref 5.0–8.0)

## 2010-12-25 LAB — COMPREHENSIVE METABOLIC PANEL
ALT: 16 U/L (ref 0–35)
Alkaline Phosphatase: 91 U/L (ref 39–117)
BUN: 2 mg/dL — ABNORMAL LOW (ref 6–23)
CO2: 21 mEq/L (ref 19–32)
Chloride: 104 mEq/L (ref 96–112)
GFR calc non Af Amer: >60 mL/min (ref >60)
Glucose, Bld: 136 mg/dL — ABNORMAL HIGH (ref 70–99)
Potassium: 2.9 mEq/L — ABNORMAL LOW (ref 3.5–5.1)
Total Bilirubin: 0.4 mg/dL (ref 0.3–1.2)

## 2010-12-25 LAB — URIC ACID: Uric Acid, Serum: 3 mg/dL (ref 2.4–7.0)

## 2010-12-25 LAB — URINE MICROSCOPIC-ADD ON

## 2010-12-25 LAB — WET PREP, GENITAL
Clue Cells Wet Prep HPF POC: NONE SEEN
Trich, Wet Prep: NONE SEEN

## 2010-12-25 LAB — LACTATE DEHYDROGENASE: LDH: 107 U/L (ref 94–250)

## 2011-01-22 NOTE — Consult Note (Signed)
[name], [name]                ACCOUNT NO.:  [id number]   MEDICAL RECORD NO.:  [id number]          PATIENT TYPE:  OUT   LOCATION:  GYN                          FACILITY:  [facility]   PHYSICIAN:  Paola A. Duard Brady, MD    DATE OF BIRTH:  04-May-1981   DATE OF CONSULTATION:  06/21/2008  DATE OF DISCHARGE:                                 CONSULTATION   REFERRING PHYSICIAN:  Vanessa P. Haygood, MD   The patient is seen today in consultation at the request of Dr. Pennie Rushing.  [name] is a 30 year old gravida 2, para 1, who is 17 weeks today.  She  had an abnormal Pap smear revealing low-grade dysplasia on May 03, 2008.  She was then seen by Dr. Pennie Rushing on May 31, 2008, for  colposcopy and directed biopsy.  Colposcopy on May 31, 2008,  revealed some cobblestoning at the 12 o'clock position, and a biopsy was  performed.  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  It was felt that it corresponded to her  previous Pap smear.  She states that she has a history of abnormal Pap  smear in 2003 when she was pregnant with her daughter.  Biopsy at that  time was negative.  Between her pregnancies, she has had routine GYN  care with normal Pap smears.  She states that her last pregnancy was  complicated by pre-term delivery.  She is followed by the nurse midwife  at their clinic but needs to be seen by a physician for delivery per her  report due to her complicated first pregnancy.  She states she has good  fetal movement, denies any bleeding or any contractions.   PAST MEDICAL HISTORY:  None.   PAST SURGICAL HISTORY:  Cholecystectomy in 2004, one spontaneous vaginal  delivery.   MEDICATIONS:  Prenatal vitamin.   ALLERGIES:  AMOXICILLIN which causes fever, itching and rash, SULFA  which causes a fever and skin irritation.   SOCIAL HISTORY:  She denies the use of  tobacco or alcohol.  She is a  Production assistant, radio at [employer].   FAMILY HISTORY:  Significant for hypertension and diabetes.  She has a  maternal aunt with scoliosis.  She has a maternal great-aunt with breast  cancer in her late 54s.  She had a maternal great-grandfather with  laryngeal carcinoma and a maternal great-great uncle with lung cancer.  They were both smokers.   PHYSICAL EXAMINATION:  VITAL SIGNS:  Height 5 feet 2-1/2 inches, weight  200 pounds, blood pressure 128/68, pulse 99.  GENERAL:  Well-nourished, well-developed female in no acute distress.  PELVIC:  External genitalia within normal limits.  The vagina is well  epithelialized.  The cervix is visualized; it is multiparous.  There is  a normal amount of mucus and discharge.  There is significant amount of  redundancy of the vaginal walls.  Colposcopic evaluation performed after  the application of acetic acid.  Prior biopsy site and changes at  12  o'clock position are noted.  After the application of acetic acid, there  was a small acetowhite epithelial area in a flame-type distribution at  the 12 o'clock position.  I do not notice any acetowhite epithelial  changes or the lesion extending into the endocervical canal.  The  ectocervical lesion is visualized in its entirety.  Bimanual  examination:  The cervix is palpably normal, long and closed.   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   PLAN:  I do not believe that we need to proceed with any conization of  the cervix during this pregnancy for CIN 1 to 2.  I do not see any  lesion extending into the cervical os.  I believe we can proceed with  repeat colposcopy in approximately 12-14 weeks during this pregnancy.  She would like to have that done with Dr. Lilian Coma office.  If that  colposcopy is reassuring, then she can go to term with this pregnancy,  have a vaginal delivery and then have routine followup and surveillance  in the postpartum.  Her questions  regarding this were elicited and  answered to her satisfaction.  She is very pleased that she does not  need to proceed with any conization procedures during the pregnancy at  this time.  She will agree to follow up with Dr. Pennie Rushing or one of her  partners for her colposcopy in the next trimester.  The patient was  encouraged to call us if she has any questions, and, of course, we will  be happy to assist Dr. Pennie Rushing in the future should the need arise.      Paola A. Duard Brady, MD  Electronically Signed     PAG/MEDQ  D:  06/21/2008  T:  06/21/2008  Job:  [id number]   cc:   Hal Morales, M.D.  Fax: [phone]   Telford Nab, R.N.  [address]  [place], [place] [address]
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
## 2011-01-25 NOTE — Op Note (Signed)
NAME:  [name], [name]                          ACCOUNT NO.:  [id number]   MEDICAL RECORD NO.:  [id number]                   PATIENT TYPE:  AMB   LOCATION:  DAY                                  FACILITY:  [facility]   PHYSICIAN:  Sharlet Salina T. Hoxworth, M.D.          DATE OF BIRTH:  23-Feb-1981   DATE OF PROCEDURE:  10/27/2002  DATE OF DISCHARGE:                                 OPERATIVE REPORT   PREOPERATIVE DIAGNOSES:  Cholelithiasis and cholecystitis.   POSTOPERATIVE DIAGNOSES:  Cholelithiasis and cholecystitis.   PROCEDURE:  Laparoscopic cholecystectomy.   SURGEON:  Lorne Skeens. Hoxworth, M.D.   ASSISTANT:  Anselm Pancoast. Zachery Dakins, M.D.   ANESTHESIA:  General.   BRIEF HISTORY:  [name] is a 30 year old white female with a several  month history of worsening episodes of right upper quadrant abdominal pain.  These have become almost daily and severe in recent weeks. She presented to  the emergency room recently and a CT scan of the abdomen was obtained which  revealed a thickened gallbladder wall with stones. LFTs are normal.  Laparoscopic cholecystectomy has been recommended and accepted. The nature  of the procedure, its indications and risks of bleeding, infection, bile  leak and bile duct injury were discussed and understood preoperatively. She  is now brought to the operating room for this procedure.   DESCRIPTION OF PROCEDURE:  The patient was brought to the operating room,  placed in supine position on the operating table and  general endotracheal  anesthesia was induced. She received preoperative antibiotics. PAS were in  place. The abdomen was sterilely prepped and draped. Local anesthesia was  used to infiltrate the trocar sites prior to the incisions. A 1 cm incision  was made in the umbilicus, dissection carried down to the midline fascia  which was sharply incised for 1 cm and the peritoneum entered under direct  vision. Through a mattress suture of #0 Vicryl, the  Hasson trocar was placed  and pneumoperitoneum established. Under direct vision, a 10 mm trocar was  placed in the subxiphoid area and two 5 mm trocars along the right subcostal  margin. The gallbladder was exposed and was significantly thickened and  inflamed. The fundus was grasped and elevated up over the liver and the  infundibulum retracted inferolaterally. There was less inflammation around  the infundibulum and Calot's triangle. Peritoneum anterior and posterior to  Calot's triangle was incised. Fibrofatty tissue was stripped down off the  neck of the gallbladder toward the porta hepatis. The cystic duct was  identified and it was dissected free and the cystic duct gallbladder  junction identified and dissected 360 degrees. Calot's triangle was  skeletonized and the cystic artery identified. When the anatomy was clear,  the cystic duct and anterior and posterior branches of the cystic artery  were divided between two proximal, one distal clip and divided. The  gallbladder was then dissected free from its bed using  hook cautery.  Complete hemostasis was obtained in the gallbladder bed. The gallbladder was  brought up to the umbilical incision and stones extracted and the  gallbladder removed. The umbilical fascial defect was closed with an  additional figure-of-eight suture of #0 Vicryl. The subhepatic and  subdiaphragmatic spaces were irrigated and suctioned until clear and  complete hemostasis assured. Trocars removed under direct vision, all CO2  evacuated from  the peritoneal cavity. The mattress suture was secured at the umbilicus. The  skin incisions were closed with interrupted subcuticular 4-0 Monocryl and  Steri-Strips. Sponge, needle and instrument counts were correct. A dry  sterile dressing was applied and the patient taken to recovery in good  condition.                                                Lorne Skeens. Hoxworth, M.D.    Tory Emerald  D:  10/27/2002  T:   10/27/2002  Job:  [id number]
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
## 2011-01-25 NOTE — Discharge Summary (Signed)
[facility]  Patient:    GRAYCEN, DEGAN Visit Number: [id number] MRN: [id number]          Service Type: OBS Location: [id number] Attending Physician:  Jaymes Graff A Dictated by:   Pierre Bali. Normand Sloop, M.D. Admit Date:  12/01/2001 Discharge Date: 12/19/2001                             Discharge Summary  DIAGNOSES:                    1. Premature, preterm rupture of membranes at                                  32 weeks.                               2. Vaginal delivery at 34 weeks.  DISCHARGE MEDICATIONS:        Motrin and Micronor.  HOSPITAL COURSE:              The patient is an 30 year old gravida 1, para 0, who presented to maternity admissions at 32 weeks with preterm rupture of membrane.  The patient was given steroids and started on Clindamycin secondary to GBS prophylaxis.  The patient then remained stable and afebrile without any problems until 34 weeks, in which she was induced and had a vaginal delivery of a little girl.  The patient then went home on December 19, 2001 with Motrin and Micronor and is to follow up in 4-6 weeks at [place]. Dictated by:   Pierre Bali. Normand Sloop, M.D. Attending Physician:  Michael Litter DD:  01/16/02 TD:  01/16/02 Job: [id number]

## 2011-05-04 ENCOUNTER — Emergency Department (HOSPITAL_COMMUNITY)
Admission: EM | Admit: 2011-05-04 | Discharge: 2011-05-04 | Disposition: A | Discharge status: Home / Self Care | Payer: Medicaid Other | Attending: Emergency Medicine | Admitting: Emergency Medicine

## 2011-05-04 DIAGNOSIS — M545 Low back pain, unspecified: Secondary | ICD-10-CM | POA: Insufficient documentation

## 2011-05-04 DIAGNOSIS — R109 Unspecified abdominal pain: Secondary | ICD-10-CM | POA: Insufficient documentation

## 2011-05-04 DIAGNOSIS — N390 Urinary tract infection, site not specified: Secondary | ICD-10-CM | POA: Insufficient documentation

## 2011-05-04 DIAGNOSIS — R300 Dysuria: Secondary | ICD-10-CM | POA: Insufficient documentation

## 2011-05-04 LAB — URINE MICROSCOPIC-ADD ON

## 2011-05-04 LAB — POCT PREGNANCY, URINE: Preg Test, Ur: NEGATIVE

## 2011-05-04 LAB — URINALYSIS, ROUTINE W REFLEX MICROSCOPIC
Ketones, ur: NEGATIVE mg/dL
Nitrite: NEGATIVE
Protein, ur: NEGATIVE mg/dL
Urobilinogen, UA: 0.2 mg/dL (ref 0.0–1.0)

## 2011-05-05 LAB — URINE CULTURE
Colony Count: 75000
Culture  Setup Time: 201208251109

## 2011-06-03 ENCOUNTER — Emergency Department (HOSPITAL_COMMUNITY)
Admission: EM | Admit: 2011-06-03 | Discharge: 2011-06-03 | Discharge status: Against Medical Advice | Payer: Medicaid Other | Attending: Emergency Medicine | Admitting: Emergency Medicine

## 2011-06-03 DIAGNOSIS — R300 Dysuria: Secondary | ICD-10-CM | POA: Insufficient documentation

## 2011-06-03 DIAGNOSIS — R3911 Hesitancy of micturition: Secondary | ICD-10-CM | POA: Insufficient documentation

## 2011-06-03 DIAGNOSIS — R350 Frequency of micturition: Secondary | ICD-10-CM | POA: Insufficient documentation

## 2011-06-03 LAB — URINALYSIS, ROUTINE W REFLEX MICROSCOPIC
Glucose, UA: NEGATIVE
Glucose, UA: NEGATIVE mg/dL
Ketones, ur: NEGATIVE
Ketones, ur: NEGATIVE mg/dL
Leukocytes, UA: NEGATIVE
Protein, ur: NEGATIVE mg/dL
Urobilinogen, UA: 0.2 mg/dL (ref 0.0–1.0)
pH: 5.5

## 2011-06-03 LAB — URINE CULTURE

## 2011-06-03 LAB — BASIC METABOLIC PANEL
Chloride: 103
GFR calc Af Amer: >60
GFR calc non Af Amer: >60
Potassium: 3.7
Sodium: 139

## 2011-06-03 LAB — DIFFERENTIAL
Eosinophils Relative: 4
Lymphocytes Relative: 29
Lymphs Abs: 3.1
Monocytes Relative: 7

## 2011-06-03 LAB — CBC
HCT: 41.6
Hemoglobin: 14.3
MCV: 92.1
Platelets: 214
RBC: 4.52
WBC: 10.7 — ABNORMAL HIGH

## 2011-06-03 LAB — URINE MICROSCOPIC-ADD ON

## 2011-06-07 LAB — HCG, QUANTITATIVE, PREGNANCY: hCG, Beta Chain, Quant, S: 34202 — ABNORMAL HIGH

## 2011-06-07 LAB — URINALYSIS, ROUTINE W REFLEX MICROSCOPIC
Bilirubin Urine: NEGATIVE
Hgb urine dipstick: NEGATIVE
Nitrite: NEGATIVE
Protein, ur: NEGATIVE
Urobilinogen, UA: 0.2

## 2011-06-07 LAB — WET PREP, GENITAL: Clue Cells Wet Prep HPF POC: NONE SEEN

## 2011-06-07 LAB — GC/CHLAMYDIA PROBE AMP, GENITAL: GC Probe Amp, Genital: NEGATIVE

## 2011-06-10 LAB — GC/CHLAMYDIA PROBE AMP, GENITAL: Chlamydia, DNA Probe: NEGATIVE

## 2011-06-10 LAB — URINALYSIS, ROUTINE W REFLEX MICROSCOPIC
Bilirubin Urine: NEGATIVE
Glucose, UA: NEGATIVE
Ketones, ur: 15 — AB
pH: 6

## 2011-06-10 LAB — URINE MICROSCOPIC-ADD ON

## 2011-06-10 LAB — WET PREP, GENITAL: Yeast Wet Prep HPF POC: NONE SEEN

## 2011-06-11 LAB — URINE MICROSCOPIC-ADD ON

## 2011-06-11 LAB — GC/CHLAMYDIA PROBE AMP, GENITAL: Chlamydia, DNA Probe: NEGATIVE

## 2011-06-11 LAB — URINALYSIS, ROUTINE W REFLEX MICROSCOPIC
Leukocytes, UA: NEGATIVE
Protein, ur: NEGATIVE
Urobilinogen, UA: 0.2

## 2011-06-11 LAB — WET PREP, GENITAL: Clue Cells Wet Prep HPF POC: NONE SEEN

## 2011-06-13 LAB — GC/CHLAMYDIA PROBE AMP, GENITAL
Chlamydia, DNA Probe: NEGATIVE
GC Probe Amp, Genital: NEGATIVE

## 2011-06-13 LAB — STREP B DNA PROBE: Strep Group B Ag: NEGATIVE

## 2011-06-13 LAB — URINALYSIS, ROUTINE W REFLEX MICROSCOPIC
Bilirubin Urine: NEGATIVE
Nitrite: NEGATIVE
Specific Gravity, Urine: 1.015 (ref 1.005–1.030)
Urobilinogen, UA: 0.2 mg/dL (ref 0.0–1.0)

## 2011-06-13 LAB — URINE MICROSCOPIC-ADD ON

## 2011-06-13 LAB — URINE CULTURE

## 2011-06-14 LAB — URINALYSIS, ROUTINE W REFLEX MICROSCOPIC
Glucose, UA: NEGATIVE
Nitrite: POSITIVE — AB
Protein, ur: >300 — AB
Specific Gravity, Urine: 1.022
pH: 6.5

## 2011-06-14 LAB — URINE CULTURE: Colony Count: 15000

## 2011-06-14 LAB — URINE MICROSCOPIC-ADD ON

## 2011-07-23 ENCOUNTER — Emergency Department (HOSPITAL_COMMUNITY)
Admission: EM | Admit: 2011-07-23 | Discharge: 2011-07-23 | Disposition: A | Discharge status: Home / Self Care | Payer: Medicaid Other | Attending: Emergency Medicine | Admitting: Emergency Medicine

## 2011-07-23 ENCOUNTER — Encounter: Payer: Self-pay | Admitting: Emergency Medicine

## 2011-07-23 ENCOUNTER — Emergency Department (HOSPITAL_COMMUNITY): Payer: Medicaid Other

## 2011-07-23 DIAGNOSIS — R05 Cough: Secondary | ICD-10-CM | POA: Insufficient documentation

## 2011-07-23 DIAGNOSIS — R509 Fever, unspecified: Secondary | ICD-10-CM | POA: Insufficient documentation

## 2011-07-23 DIAGNOSIS — J40 Bronchitis, not specified as acute or chronic: Secondary | ICD-10-CM

## 2011-07-23 DIAGNOSIS — R0602 Shortness of breath: Secondary | ICD-10-CM | POA: Insufficient documentation

## 2011-07-23 DIAGNOSIS — R059 Cough, unspecified: Secondary | ICD-10-CM | POA: Insufficient documentation

## 2011-07-23 MED ORDER — AZITHROMYCIN 250 MG PO TABS: 250.0000 mg | ORAL_TABLET | Freq: Every day | ORAL | Status: AC

## 2011-07-23 MED ORDER — ACETAMINOPHEN 500 MG PO TABS
1000.0000 mg | ORAL_TABLET | Freq: Once | ORAL | Status: AC
  Administered 2011-07-23: 1000 mg via ORAL
  Filled 2011-07-23: qty 2

## 2011-07-23 MED ORDER — BENZONATATE 100 MG PO CAPS: 100.0000 mg | ORAL_CAPSULE | Freq: Three times a day (TID) | ORAL | Status: AC

## 2011-07-23 NOTE — ED Provider Notes (Signed)
History     CSN: [id number] Arrival date & time: 07/23/2011  1:46 PM   First MD Initiated Contact with Patient 07/23/11 1523      Chief Complaint  Patient presents with  . Cough  . Generalized Body Aches  . Fever    (Consider location/radiation/quality/duration/timing/severity/associated sxs/prior treatment) Patient is a 30 y.o. female presenting with cough and fever. The history is provided by the patient.  Cough This is a new problem. The cough is non-productive.  Fever Primary symptoms of the febrile illness include fever and cough.   Pt presents to the ED with complaints of coughing for mutiple weeks. She was diagnosed with the flu earlier this month but her coughing has only been getting worse. Pt has nottaken abx. No complaints of chest pain, coughing is mild/severe. Pt states she has had fever sand body aches.  History reviewed. No pertinent past medical history.  Past Surgical History  Procedure Date  . Cholecystectomy     No family history on file.  History  Substance Use Topics  . Smoking status: Not on file  . Smokeless tobacco: Not on file  . Alcohol Use: No    OB History    Grav Para Term Preterm Abortions TAB SAB Ect Mult Living                  Review of Systems  Constitutional: Positive for fever.  Respiratory: Positive for cough.   All other systems reviewed and are negative.    Allergies  Sulfa antibiotics and Amoxicillin  Home Medications   Current Outpatient Rx  Name Route Sig Dispense Refill  . CLONAZEPAM 1 MG PO TABS Oral Take 1 mg by mouth 3 (three) times daily as needed. For anxiety     . HYDROCHLOROTHIAZIDE 25 MG PO TABS Oral Take 25 mg by mouth daily.      Marland Kitchen VITAMIN C 500 MG PO TABS Oral Take 500 mg by mouth daily.        BP 136/83  Pulse 106  Temp(Src) 100.5 F (38.1 C) (Oral)  Resp 20  Wt 160 lb (72.576 kg)  SpO2 99%  LMP 07/15/2011  Physical Exam  Vitals reviewed. Constitutional: She is oriented to person,  place, and time. She appears well-developed and well-nourished.  HENT:  Head: Normocephalic and atraumatic.  Eyes: Conjunctivae and EOM are normal. Pupils are equal, round, and reactive to light.  Neck: Normal range of motion. Neck supple.  Cardiovascular: Normal rate, regular rhythm and normal heart sounds.   Pulmonary/Chest: Effort normal and breath sounds normal.  Abdominal: Soft. Bowel sounds are normal.  Musculoskeletal: Normal range of motion.  Neurological: She is alert and oriented to person, place, and time.  Skin: Skin is warm and dry.    ED Course  Procedures (including critical care time)  Labs Reviewed - No data to display Dg Chest 2 View  07/23/2011  *RADIOLOGY REPORT*  Clinical Data: Shortness of breath, fever, body aches  CHEST - 2 VIEW  Comparison: Chest x-ray of 02/14/2008  Findings: No pneumonia is seen.  However, there are prominent perihilar markings with peribronchial thickening which may indicate bronchitis.  Mediastinal contours are stable.  The heart is within normal limits in size.  No bony abnormality is seen.  IMPRESSION: No pneumonia.  Probable bronchitis.  Original Report Authenticated By: Juline Patch, M.D.     No diagnosis found.    MDM   No EKG done.       Aunna Snooks G  Neva Seat, [place] 07/24/11 [phone]

## 2011-07-23 NOTE — ED Notes (Signed)
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 States that she started with a cough, and the cough is now worse. She states that she also has been exposed to Pneumonia

## 2011-07-24 NOTE — ED Provider Notes (Signed)
Medical screening examination/treatment/procedure(s) were performed by non-physician practitioner and as supervising physician I was immediately available for consultation/collaboration.   Dayton Bailiff, MD 07/24/11 [phone]

## 2011-07-28 DIAGNOSIS — M542 Cervicalgia: Secondary | ICD-10-CM | POA: Insufficient documentation

## 2011-07-29 ENCOUNTER — Encounter (HOSPITAL_COMMUNITY): Payer: Self-pay | Admitting: *Deleted

## 2011-07-29 ENCOUNTER — Emergency Department (HOSPITAL_COMMUNITY)
Admission: EM | Admit: 2011-07-29 | Discharge: 2011-07-29 | Discharge status: Against Medical Advice | Payer: Medicaid Other | Attending: Emergency Medicine | Admitting: Emergency Medicine

## 2011-07-29 NOTE — ED Notes (Signed)
Pt was placing a wooden box (wood type unspecified) onto a shelf yesterday evening.  The pt then turned around only to be struck in the general posterior neck area by said wooden box.  Pt presents with neck pain.

## 2011-08-31 ENCOUNTER — Emergency Department (INDEPENDENT_AMBULATORY_CARE_PROVIDER_SITE_OTHER)
Admission: EM | Admit: 2011-08-31 | Discharge: 2011-08-31 | Disposition: A | Discharge status: Home / Self Care | Payer: Medicaid Other | Source: Home / Self Care

## 2011-08-31 ENCOUNTER — Encounter (HOSPITAL_COMMUNITY): Payer: Self-pay | Admitting: *Deleted

## 2011-08-31 DIAGNOSIS — J209 Acute bronchitis, unspecified: Secondary | ICD-10-CM

## 2011-08-31 DIAGNOSIS — J069 Acute upper respiratory infection, unspecified: Secondary | ICD-10-CM

## 2011-08-31 HISTORY — DX: Anxiety disorder, unspecified: F41.9

## 2011-08-31 MED ORDER — ALBUTEROL SULFATE HFA 108 (90 BASE) MCG/ACT IN AERS: 2.0000 | INHALATION_SPRAY | RESPIRATORY_TRACT | Status: DC | PRN

## 2011-08-31 MED ORDER — GUAIFENESIN-CODEINE 100-10 MG/5ML PO SYRP: ORAL_SOLUTION | ORAL | Status: AC

## 2011-08-31 MED ORDER — PREDNISONE 20 MG PO TABS: 20.0000 mg | ORAL_TABLET | Freq: Two times a day (BID) | ORAL | Status: AC

## 2011-08-31 NOTE — ED Provider Notes (Signed)
Medical screening examination/treatment/procedure(s) were performed by non-physician practitioner and as supervising physician I was immediately available for consultation/collaboration.  LANEY,RONNIE   Ronnie Laney, MD 08/31/11 2111 

## 2011-08-31 NOTE — ED Provider Notes (Signed)
History     CSN: [id number]  Arrival date & time 08/31/11  1536   None     Chief Complaint  Patient presents with  . Cough  . Nasal Congestion    (Consider location/radiation/quality/duration/timing/severity/associated sxs/prior treatment) HPI Comments: Onset of cough over 1 month ago. Was seen at [facility] ED on 07-23-11 and dx with bronchitis. She was prescribed a Zpak and cough significantly improved but did not 100 % resolve. In the last one week cough has worsened. It is nonproductive and keeps her awake at night even with the use of otc cough medications. No dyspnea or wheezing. She also has some clear rhinorrhea x 1 week which improves with the use of an otc decongestant.   The history is provided by the patient.    Past Medical History  Diagnosis Date  . Anxiety   . Hypertension     Past Surgical History  Procedure Date  . Cholecystectomy     History reviewed. No pertinent family history.  History  Substance Use Topics  . Smoking status: Former Smoker    Types: Cigarettes  . Smokeless tobacco: Never Used  . Alcohol Use: No    OB History    Grav Para Term Preterm Abortions TAB SAB Ect Mult Living                  Review of Systems  Constitutional: Negative for fever and chills.  HENT: Positive for congestion and rhinorrhea. Negative for ear pain, sore throat and sinus pressure.   Respiratory: Positive for cough.   Cardiovascular: Negative for chest pain.    Allergies  Sulfa antibiotics and Amoxicillin  Home Medications   Current Outpatient Rx  Name Route Sig Dispense Refill  . CLONAZEPAM 1 MG PO TABS Oral Take 1 mg by mouth 3 (three) times daily as needed. For anxiety     . ALBUTEROL SULFATE HFA 108 (90 BASE) MCG/ACT IN AERS Inhalation Inhale 2 puffs into the lungs every 4 (four) hours as needed for wheezing. 1 Inhaler 0  . GUAIFENESIN-CODEINE 100-10 MG/5ML PO SYRP  1-2 tsp every 6 hrs prn cough 120 mL 0  . HYDROCHLOROTHIAZIDE 25 MG PO TABS  Oral Take 25 mg by mouth daily.      Marland Kitchen PREDNISONE 20 MG PO TABS Oral Take 1 tablet (20 mg total) by mouth 2 (two) times daily. 10 tablet 0  . VITAMIN C 500 MG PO TABS Oral Take 500 mg by mouth daily.        BP 130/72  Pulse 90  Temp(Src) 98.3 F (36.8 C) (Oral)  Resp 18  SpO2 99%  LMP 08/29/2011  Physical Exam  Nursing note and vitals reviewed. Constitutional: She appears well-developed and well-nourished. No distress.  HENT:  Head: Normocephalic and atraumatic.  Right Ear: Tympanic membrane, external ear and ear canal normal.  Left Ear: Tympanic membrane, external ear and ear canal normal.  Nose: Nose normal. Right sinus exhibits no maxillary sinus tenderness and no frontal sinus tenderness. Left sinus exhibits no maxillary sinus tenderness and no frontal sinus tenderness.  Mouth/Throat: Uvula is midline, oropharynx is clear and moist and mucous membranes are normal. No oropharyngeal exudate, posterior oropharyngeal edema or posterior oropharyngeal erythema.  Neck: Neck supple.  Cardiovascular: Normal rate, regular rhythm and normal heart sounds.   Pulmonary/Chest: Effort normal and breath sounds normal. No respiratory distress. She has no wheezes. She has no rales.       Deep cough noted  Lymphadenopathy:  She has no cervical adenopathy.  Neurological: She is alert.  Skin: Skin is warm and dry.  Psychiatric: She has a normal mood and affect.    ED Course  Procedures (including critical care time)  Labs Reviewed - No data to display No results found.   1. Acute bronchitis   2. Acute URI       MDM  07-23-11 CXR report reviewed.         Melody Comas, [place] 08/31/11 [phone]

## 2011-08-31 NOTE — ED Notes (Signed)
Pt c/o productive cough (clear in color) & congestion x 1 week. Reports was diagnosed w/ bronchitis last month at [facility] - "Got better, then it got worse again". Reports using Claritin w/ minimal relief.

## 2011-09-16 DIAGNOSIS — R87619 Unspecified abnormal cytological findings in specimens from cervix uteri: Secondary | ICD-10-CM | POA: Insufficient documentation

## 2011-11-12 ENCOUNTER — Encounter (INDEPENDENT_AMBULATORY_CARE_PROVIDER_SITE_OTHER): Payer: Medicaid Other

## 2011-11-12 ENCOUNTER — Encounter (INDEPENDENT_AMBULATORY_CARE_PROVIDER_SITE_OTHER): Payer: Medicaid Other | Admitting: Obstetrics and Gynecology

## 2011-11-12 DIAGNOSIS — R87611 Atypical squamous cells cannot exclude high grade squamous intraepithelial lesion on cytologic smear of cervix (ASC-H): Secondary | ICD-10-CM

## 2011-11-28 ENCOUNTER — Encounter (INDEPENDENT_AMBULATORY_CARE_PROVIDER_SITE_OTHER): Payer: Medicaid Other | Admitting: Obstetrics and Gynecology

## 2011-11-28 DIAGNOSIS — R87613 High grade squamous intraepithelial lesion on cytologic smear of cervix (HGSIL): Secondary | ICD-10-CM

## 2011-12-10 ENCOUNTER — Encounter (HOSPITAL_COMMUNITY): Payer: Self-pay

## 2011-12-13 ENCOUNTER — Encounter (HOSPITAL_COMMUNITY): Payer: Self-pay | Admitting: Anesthesiology

## 2011-12-13 ENCOUNTER — Encounter (HOSPITAL_COMMUNITY)
Admission: RE | Disposition: A | Discharge status: Home / Self Care | Payer: Self-pay | Source: Ambulatory Visit | Attending: Obstetrics and Gynecology

## 2011-12-13 ENCOUNTER — Ambulatory Visit (HOSPITAL_COMMUNITY)
Admission: RE | Admit: 2011-12-13 | Discharge: 2011-12-13 | Disposition: A | Discharge status: Home / Self Care | Payer: Medicaid Other | Source: Ambulatory Visit | Attending: Obstetrics and Gynecology | Admitting: Obstetrics and Gynecology

## 2011-12-13 ENCOUNTER — Encounter (HOSPITAL_COMMUNITY): Payer: Self-pay | Admitting: *Deleted

## 2011-12-13 ENCOUNTER — Ambulatory Visit (HOSPITAL_COMMUNITY): Payer: Medicaid Other | Admitting: Anesthesiology

## 2011-12-13 DIAGNOSIS — D069 Carcinoma in situ of cervix, unspecified: Secondary | ICD-10-CM

## 2011-12-13 DIAGNOSIS — F419 Anxiety disorder, unspecified: Secondary | ICD-10-CM | POA: Diagnosis present

## 2011-12-13 DIAGNOSIS — R87613 High grade squamous intraepithelial lesion on cytologic smear of cervix (HGSIL): Secondary | ICD-10-CM | POA: Diagnosis present

## 2011-12-13 HISTORY — DX: Reserved for concepts with insufficient information to code with codable children: IMO0002

## 2011-12-13 HISTORY — PX: CERVICAL CONIZATION W/BX: SHX1330

## 2011-12-13 HISTORY — DX: Headache: R51

## 2011-12-13 HISTORY — DX: Unspecified abnormal cytological findings in specimens from cervix uteri: R87.619

## 2011-12-13 LAB — CBC
HCT: 45.7 % (ref 36.0–46.0)
Hemoglobin: 15.6 g/dL — ABNORMAL HIGH (ref 12.0–15.0)
MCHC: 34.1 g/dL (ref 30.0–36.0)
MCV: 94.4 fL (ref 78.0–100.0)
WBC: 12.3 10*3/uL — ABNORMAL HIGH (ref 4.0–10.5)

## 2011-12-13 LAB — PREGNANCY, URINE: Preg Test, Ur: NEGATIVE

## 2011-12-13 SURGERY — CONE BIOPSY, CERVIX
Anesthesia: General | Site: Vagina | Wound class: Clean Contaminated

## 2011-12-13 MED ORDER — KETOROLAC TROMETHAMINE 30 MG/ML IJ SOLN
INTRAMUSCULAR | Status: DC | PRN
  Administered 2011-12-13: 30 mg via INTRAVENOUS

## 2011-12-13 MED ORDER — MEPERIDINE HCL 25 MG/ML IJ SOLN: 6.2500 mg | INTRAMUSCULAR | Status: DC | PRN

## 2011-12-13 MED ORDER — KETOROLAC TROMETHAMINE 30 MG/ML IJ SOLN: 15.0000 mg | Freq: Once | INTRAMUSCULAR | Status: DC | PRN

## 2011-12-13 MED ORDER — VASOPRESSIN 20 UNIT/ML IJ SOLN
INTRAVENOUS | Status: DC | PRN
  Administered 2011-12-13: 16:00:00 via INTRAMUSCULAR

## 2011-12-13 MED ORDER — KETOROLAC TROMETHAMINE 60 MG/2ML IM SOLN
INTRAMUSCULAR | Status: AC
  Filled 2011-12-13: qty 2

## 2011-12-13 MED ORDER — ACETIC ACID 4% SOLUTION
Status: DC | PRN
  Administered 2011-12-13: 1 via TOPICAL

## 2011-12-13 MED ORDER — PROPOFOL 10 MG/ML IV EMUL
INTRAVENOUS | Status: AC
  Filled 2011-12-13: qty 20

## 2011-12-13 MED ORDER — LACTATED RINGERS IV SOLN
INTRAVENOUS | Status: DC
  Administered 2011-12-13: 16:00:00 via INTRAVENOUS

## 2011-12-13 MED ORDER — HEMOSTATIC AGENTS (NO CHARGE) OPTIME
TOPICAL | Status: DC | PRN
  Administered 2011-12-13: 1 via TOPICAL

## 2011-12-13 MED ORDER — KETOROLAC TROMETHAMINE 60 MG/2ML IM SOLN
INTRAMUSCULAR | Status: DC | PRN
  Administered 2011-12-13: 30 mg via INTRAMUSCULAR

## 2011-12-13 MED ORDER — HYDROCODONE-ACETAMINOPHEN 5-500 MG PO TABS: 1.0000 | ORAL_TABLET | Freq: Four times a day (QID) | ORAL | Status: AC | PRN

## 2011-12-13 MED ORDER — LIDOCAINE HCL 2 % IJ SOLN
INTRAMUSCULAR | Status: AC
  Filled 2011-12-13: qty 1

## 2011-12-13 MED ORDER — DEXAMETHASONE SODIUM PHOSPHATE 10 MG/ML IJ SOLN
INTRAMUSCULAR | Status: AC
  Filled 2011-12-13: qty 1

## 2011-12-13 MED ORDER — LIDOCAINE HCL (CARDIAC) 20 MG/ML IV SOLN
INTRAVENOUS | Status: AC
  Filled 2011-12-13: qty 5

## 2011-12-13 MED ORDER — VASOPRESSIN 20 UNIT/ML IJ SOLN
INTRAMUSCULAR | Status: AC
  Filled 2011-12-13: qty 1

## 2011-12-13 MED ORDER — PROPOFOL 10 MG/ML IV EMUL
INTRAVENOUS | Status: DC | PRN
  Administered 2011-12-13: 200 mg via INTRAVENOUS

## 2011-12-13 MED ORDER — GELATIN ABSORBABLE 12-7 MM EX MISC
CUTANEOUS | Status: AC
  Filled 2011-12-13: qty 1

## 2011-12-13 MED ORDER — ONDANSETRON HCL 4 MG/2ML IJ SOLN
INTRAMUSCULAR | Status: DC | PRN
  Administered 2011-12-13: 4 mg via INTRAVENOUS

## 2011-12-13 MED ORDER — FENTANYL CITRATE 0.05 MG/ML IJ SOLN
INTRAMUSCULAR | Status: AC
  Filled 2011-12-13: qty 2

## 2011-12-13 MED ORDER — GLYCOPYRROLATE 0.2 MG/ML IJ SOLN
INTRAMUSCULAR | Status: AC
  Filled 2011-12-13: qty 1

## 2011-12-13 MED ORDER — ACETAMINOPHEN 325 MG PO TABS: 325.0000 mg | ORAL_TABLET | ORAL | Status: DC | PRN

## 2011-12-13 MED ORDER — FENTANYL CITRATE 0.05 MG/ML IJ SOLN
INTRAMUSCULAR | Status: AC
  Administered 2011-12-13: 50 ug via INTRAVENOUS
  Filled 2011-12-13: qty 2

## 2011-12-13 MED ORDER — MIDAZOLAM HCL 2 MG/2ML IJ SOLN
INTRAMUSCULAR | Status: AC
  Filled 2011-12-13: qty 2

## 2011-12-13 MED ORDER — PROMETHAZINE HCL 25 MG/ML IJ SOLN: 6.2500 mg | INTRAMUSCULAR | Status: DC | PRN

## 2011-12-13 MED ORDER — ONDANSETRON HCL 4 MG/2ML IJ SOLN
INTRAMUSCULAR | Status: AC
  Filled 2011-12-13: qty 2

## 2011-12-13 MED ORDER — MIDAZOLAM HCL 2 MG/2ML IJ SOLN: 0.5000 mg | Freq: Once | INTRAMUSCULAR | Status: DC | PRN

## 2011-12-13 MED ORDER — IODINE STRONG (LUGOLS) 5 % PO SOLN
ORAL | Status: DC | PRN
  Administered 2011-12-13: 0.1 mL via ORAL

## 2011-12-13 MED ORDER — IBUPROFEN 600 MG PO TABS: 600.0000 mg | ORAL_TABLET | Freq: Four times a day (QID) | ORAL | Status: AC

## 2011-12-13 MED ORDER — FENTANYL CITRATE 0.05 MG/ML IJ SOLN
INTRAMUSCULAR | Status: DC | PRN
  Administered 2011-12-13: 100 ug via INTRAVENOUS
  Administered 2011-12-13 (×2): 50 ug via INTRAVENOUS

## 2011-12-13 MED ORDER — KETOROLAC TROMETHAMINE 30 MG/ML IJ SOLN
INTRAMUSCULAR | Status: AC
  Filled 2011-12-13: qty 2

## 2011-12-13 MED ORDER — FERRIC SUBSULFATE SOLN
Status: DC | PRN
  Administered 2011-12-13: 1

## 2011-12-13 MED ORDER — FENTANYL CITRATE 0.05 MG/ML IJ SOLN
25.0000 ug | INTRAMUSCULAR | Status: DC | PRN
  Administered 2011-12-13 (×4): 50 ug via INTRAVENOUS

## 2011-12-13 MED ORDER — LIDOCAINE HCL 2 % IJ SOLN
INTRAMUSCULAR | Status: DC | PRN
  Administered 2011-12-13: 20 mL

## 2011-12-13 MED ORDER — DEXAMETHASONE SODIUM PHOSPHATE 4 MG/ML IJ SOLN
INTRAMUSCULAR | Status: DC | PRN
  Administered 2011-12-13: 8 mg via INTRAVENOUS

## 2011-12-13 MED ORDER — LIDOCAINE HCL (CARDIAC) 20 MG/ML IV SOLN
INTRAVENOUS | Status: DC | PRN
  Administered 2011-12-13: 30 mg via INTRAVENOUS

## 2011-12-13 MED ORDER — MIDAZOLAM HCL 5 MG/5ML IJ SOLN
INTRAMUSCULAR | Status: DC | PRN
  Administered 2011-12-13: 2 mg via INTRAVENOUS

## 2011-12-13 SURGICAL SUPPLY — 26 items
APPLICATOR COTTON TIP 6IN STRL (MISCELLANEOUS) ×2 IMPLANT
BLADE SURG 11 STRL SS (BLADE) ×2 IMPLANT
CLOTH BEACON ORANGE TIMEOUT ST (SAFETY) ×2 IMPLANT
CONTAINER PREFILL 10% NBF 60ML (FORM) ×2 IMPLANT
COUNTER NEEDLE 1200 MAGNETIC (NEEDLE) ×2 IMPLANT
ELECT LLETZ BALL 3MM DISP (ELECTRODE) IMPLANT
ELECT REM PT RETURN 9FT ADLT (ELECTROSURGICAL) ×2
ELECTRODE REM PT RTRN 9FT ADLT (ELECTROSURGICAL) ×1 IMPLANT
GAUZE SPONGE 4X4 16PLY XRAY LF (GAUZE/BANDAGES/DRESSINGS) IMPLANT
GLOVE SURG SS PI 6.5 STRL IVOR (GLOVE) ×4 IMPLANT
GOWN PREVENTION PLUS LG XLONG (DISPOSABLE) ×4 IMPLANT
NEEDLE SPNL 22GX3.5 QUINCKE BK (NEEDLE) ×4 IMPLANT
PACK VAGINAL MINOR WOMEN LF (CUSTOM PROCEDURE TRAY) ×2 IMPLANT
PENCIL BUTTON HOLSTER BLD 10FT (ELECTRODE) ×2 IMPLANT
SCOPETTES 8  STERILE (MISCELLANEOUS) ×1
SCOPETTES 8 STERILE (MISCELLANEOUS) ×1 IMPLANT
SPONGE SURGIFOAM ABS GEL 12-7 (HEMOSTASIS) ×2 IMPLANT
SUT VIC AB 0 CT1 18XCR BRD8 (SUTURE) ×1 IMPLANT
SUT VIC AB 0 CT1 8-18 (SUTURE) ×1
SUT VIC AB 0 CT2 27 (SUTURE) IMPLANT
SYR CONTROL 10ML LL (SYRINGE) ×4 IMPLANT
SYR TB 1ML 27GX1/2 SAFE (SYRINGE) IMPLANT
SYR TB 1ML 27GX1/2 SAFETY (SYRINGE)
TOWEL OR 17X24 6PK STRL BLUE (TOWEL DISPOSABLE) ×4 IMPLANT
TUBING NON-CON 1/4 X 20 CONN (TUBING) ×2 IMPLANT
YANKAUER SUCT BULB TIP NO VENT (SUCTIONS) ×2 IMPLANT

## 2011-12-13 NOTE — Progress Notes (Signed)
Completed at 1850

## 2011-12-13 NOTE — Anesthesia Procedure Notes (Signed)

## 2011-12-13 NOTE — Discharge Instructions (Signed)
Conization of the Cervix Care After These instructions give you information on caring for yourself after your procedure. Your doctor may also give you more specific instructions. Call your doctor if you have any problems or questions after your procedure. HOME CARE   Have someone with you for 2 to 4 days after the procedure.   Do not drive for 24 hours.   Only take medicines as told by your doctor. Do not take aspirin. It can cause bleeding.   Take showers for the first week. Do not take baths, swim, or use hot tubs until your doctor says it is okay.   Do not douche, use tampons, or have sex (intercourse) until your doctor says it is okay.   Rest often.   Wait 1 week before returning to normal activities.   Do not lift, push, or pull anything over 10 pounds (about the weight of a gallon of milk).   Do not stand for a long time.   Limit stair climbing to once or twice a day.   If you cannot poop (constipated), you may:   Take a mild laxative as told by your doctor.   Add fruit and bran to your diet.   Drink enough fluids to keep your pee (urine) clear or pale yellow.   Resume your usual diet.   Keep your follow-up appointments as told by your doctor.  GET HELP RIGHT AWAY IF:   You have blood clots, bleeding that is heavier than your period, or bright red bleeding.   You have a fever of 102 F (38.9 C) or higher.   You pass out (faint).   You have pain when you pee or there is blood in your pee.   You start throwing up (vomiting).   Your cramps are getting worse.   Your pain is not relieved with medication.   You develop a rash.   You are dizzy or lightheaded.   You feel sick to your stomach (nauseous).   You develop a bad smelling vaginal discharge.   You have an abnormal reaction or allergy to your medicine.   You need stronger pain medication.  MAKE SURE YOU:   Understand these instructions.   Will watch your condition.   Will get help right away  if you are not doing well or get worse.  Document Released: 06/04/2008 Document Revised: 08/15/2011 Document Reviewed: 06/04/2008 [facility] Patient Information 2012 [place], [place].

## 2011-12-13 NOTE — Op Note (Signed)
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  Estimated blood loss: Less than 50 cc  Complications:none  Finding: There was a nonstaining area at 9:00 and another at 11:00. the uterus sounded to 7 cm  Procedure:  The patient was taken to the operating room after appropriate identification and placed on the operating table. After the attainment of adequate general anesthesia she was placed in the lithotomy position. The perineum was prepped with multiple layers of Betadine and the vagina gently prepped with Betadine. A catheter was used to empty the bladder. A weighted speculum was placed in the posterior vagina and a retractor used to elevate the bladder. A paracervical block was achieved with a total of 10 cc of 2% Xylocaine in the 5 and 7:00 positions. The transition zone of the cervix was then infiltrated with a dilute solution of Pitressin. Anchoring sutures were placed at the 3 and 9:00 position and tied down.  Lugol's stain was applied to the cervix.  The cervix and uterus were sounded. A cone shaped specimen was then excised, including the endocervical canal.   The specimen was marked at the 12:00 position.  It was removed from the operative field and sent to pathology. The conization bed was then closed with Sturmdorf sutures to achieve adequate hemostasis. A piece of Gelfoam was then placed in the conization bed and all instruments were removed from the operative field.  The patient was awakened from general anesthesia and taken to the recovery room in satisfactory condition having tolerated the procedure well with sponge and instrument counts correct.  Specimen to pathology: Cone of the cervix

## 2011-12-13 NOTE — Anesthesia Preprocedure Evaluation (Signed)
Anesthesia Evaluation  Patient identified by MRN, date of birth, ID band Patient awake    Reviewed: Allergy & Precautions, H&P , Patient's Chart, lab work & pertinent test results, reviewed documented beta blocker date and time   History of Anesthesia Complications Negative for: history of anesthetic complications  Airway Mallampati: II TM Distance: >3 FB Neck ROM: full    Dental No notable dental hx.    Pulmonary neg pulmonary ROS,  breath sounds clear to auscultation  Pulmonary exam normal       Cardiovascular Exercise Tolerance: Good hypertension, negative cardio ROS  Rhythm:regular Rate:Normal     Neuro/Psych PSYCHIATRIC DISORDERS negative neurological ROS  negative psych ROS   GI/Hepatic negative GI ROS, Neg liver ROS,   Endo/Other  negative endocrine ROS  Renal/GU negative Renal ROS     Musculoskeletal   Abdominal   Peds  Hematology negative hematology ROS (+)   Anesthesia Other Findings   Reproductive/Obstetrics negative OB ROS                           Anesthesia Physical Anesthesia Plan  ASA: II  Anesthesia Plan: General LMA   Post-op Pain Management:    Induction:   Airway Management Planned:   Additional Equipment:   Intra-op Plan:   Post-operative Plan:   Informed Consent: I have reviewed the patients History and Physical, chart, labs and discussed the procedure including the risks, benefits and alternatives for the proposed anesthesia with the patient or authorized representative who has indicated his/her understanding and acceptance.   Dental Advisory Given  Plan Discussed with: CRNA, Surgeon and Anesthesiologist  Anesthesia Plan Comments:         Anesthesia Quick Evaluation

## 2011-12-13 NOTE — Transfer of Care (Signed)
Immediate Anesthesia Transfer of Care Note  Patient: [name]  Procedure(s) Performed: Procedure(s) (LRB): CONIZATION CERVIX WITH BIOPSY (N/A)  Patient Location: PACU  Anesthesia Type: General  Level of Consciousness: awake, alert  and oriented  Airway & Oxygen Therapy: Patient Spontanous Breathing and Patient connected to nasal cannula oxygen  Post-op Assessment: Report given to PACU RN and Post -op Vital signs reviewed and stable  Post vital signs: Reviewed and stable  Complications: No apparent anesthesia complications

## 2011-12-13 NOTE — Anesthesia Postprocedure Evaluation (Signed)
  Anesthesia Post-op Note  Patient: [name]  Procedure(s) Performed: Procedure(s) (LRB): CONIZATION CERVIX WITH BIOPSY (N/A) Patient is awake and responsive. Pain and nausea are reasonably well controlled. Vital signs are stable and clinically acceptable. Oxygen saturation is clinically acceptable. There are no apparent anesthetic complications at this time. Patient is ready for discharge.

## 2011-12-13 NOTE — H&P (Signed)
[name] is an 31 y.o. female.  Admitted for conization of the cervix 4 high-grade cervical intraepithelial neoplasia Pertinent Gynecological History: Menses: regular Bleeding: No intermenstrual bleeding Contraception: OCP (estrogen/progesterone) DES exposure: unknown Blood transfusions: none Sexually transmitted diseases: no recent history Previous GYN Procedures: colposcopy  Last mammogram: na Date: na Last pap: see above Date: 09/16/11 OB History: G2, P2   Menstrual History: Menarche age: 89 Patient's last menstrual period was 11/28/2011.    Past Medical History  Diagnosis Date  . Anxiety   . PIH (pregnancy induced hypertension)     hx 3 yrs ag0 - no problems since delivery  . Headache     otc meds prn - last one 2 wks ago  . Abnormal Pap smear     Past Surgical History  Procedure Date  . Cholecystectomy 2004  . Wisdom tooth extraction   . Svd     x 2  . Colonoscopy   . Upper gastrointestinal endoscopy     History reviewed. No pertinent family history.  Social History:  reports that she has quit smoking. Her smoking use included Cigarettes. She has a 1.5 pack-year smoking history. She has never used smokeless tobacco. She reports that she does not drink alcohol or use illicit drugs.  Allergies:  Allergies  Allergen Reactions  . Sulfa Antibiotics Rash    fever  . Amoxicillin Rash    High fever    Prescriptions prior to admission  Medication Sig Dispense Refill  . acetaminophen (TYLENOL) 500 MG tablet Take 500 mg by mouth every 6 (six) hours as needed.      . clonazePAM (KLONOPIN) 1 MG tablet Take 1 mg by mouth 3 (three) times daily as needed. For anxiety       . norgestimate-ethinyl estradiol (SPRINTEC 28) 0.25-35 MG-MCG tablet Take 1 tablet by mouth daily.        Review of Systems  Constitutional: Negative.   HENT: Negative.   Eyes: Negative.   Respiratory: Negative.   Cardiovascular: Negative.   Gastrointestinal: Negative.   Genitourinary:  Negative.   Musculoskeletal: Negative.   Skin: Negative.   Neurological: Negative.   Endo/Heme/Allergies: Negative.   Psychiatric/Behavioral: The patient is nervous/anxious.     Blood pressure 111/68, pulse 81, temperature 98.1 F (36.7 C), temperature source Oral, resp. rate 18, height 5\' 3"  (1.6 m), weight 180 lb (81.647 kg), last menstrual period 11/28/2011, SpO2 97.00%. Physical Exam  Constitutional: She is oriented to person, place, and time. She appears well-developed and well-nourished.  HENT:  Head: Normocephalic and atraumatic.  Eyes: EOM are normal.  Neck: Normal range of motion. Neck supple. No thyromegaly present.  Cardiovascular: Normal rate and regular rhythm.   Respiratory: Effort normal and breath sounds normal. She has no wheezes. She has no rales.  GI: Soft. Bowel sounds are normal. There is no tenderness.  Genitourinary: Vagina normal and uterus normal.  Musculoskeletal: Normal range of motion.  Neurological: She is alert and oriented to person, place, and time.  Skin: Skin is warm and dry.    Results for orders placed during the hospital encounter of 12/13/11 (from the past 24 hour(s))  PREGNANCY, URINE     Status: Normal   Collection Time   12/13/11 12:05 PM      Component Value Range   Preg Test, Ur NEGATIVE  NEGATIVE   CBC     Status: Abnormal   Collection Time   12/13/11 12:11 PM      Component Value Range  WBC 12.3 (*) 4.0 - 10.5 (K/uL)   RBC 4.84  3.87 - 5.11 (MIL/uL)   Hemoglobin 15.6 (*) 12.0 - 15.0 (g/dL)   HCT 45.4  09.8 - 11.9 (%)   MCV 94.4  78.0 - 100.0 (fL)   MCH 32.2  26.0 - 34.0 (pg)   MCHC 34.1  30.0 - 36.0 (g/dL)   RDW 14.7  82.9 - 56.2 (%)   Platelets 222  150 - 400 (K/uL)    No results found.  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 Endocervical curettings negative for dysplasia Plan: : Conization of the cervix. The indications risks and  benefits have been explained to the patient and she wishes to proceed. The risks have been explained as including but not limited to anesthesia bleeding infection and damage to adjacent organs. We will proceed at [facility] on 12/13/2011 Darlisha Kelm P 12/13/2011, 3:42 PM

## 2011-12-16 ENCOUNTER — Encounter (HOSPITAL_COMMUNITY): Payer: Self-pay | Admitting: Obstetrics and Gynecology

## 2011-12-24 ENCOUNTER — Telehealth: Payer: Self-pay | Admitting: Obstetrics and Gynecology

## 2011-12-24 NOTE — Telephone Encounter (Signed)
[facility] pt 

## 2011-12-25 NOTE — Telephone Encounter (Signed)
PC TO PT PER TELEPHONE CALL. POST OP APPT SCHED 12/30/11 WITH [facility]. PT AGREES.

## 2011-12-30 ENCOUNTER — Ambulatory Visit (INDEPENDENT_AMBULATORY_CARE_PROVIDER_SITE_OTHER): Payer: Medicaid Other | Admitting: Obstetrics and Gynecology

## 2011-12-30 ENCOUNTER — Encounter: Payer: Self-pay | Admitting: Obstetrics and Gynecology

## 2011-12-30 VITALS — BP 128/78 | Temp 98.3°F | Ht 63.0 in | Wt 214.0 lb

## 2011-12-30 DIAGNOSIS — E669 Obesity, unspecified: Secondary | ICD-10-CM

## 2011-12-30 DIAGNOSIS — Z9889 Other specified postprocedural states: Secondary | ICD-10-CM

## 2011-12-30 DIAGNOSIS — R87613 High grade squamous intraepithelial lesion on cytologic smear of cervix (HGSIL): Secondary | ICD-10-CM

## 2011-12-30 NOTE — Progress Notes (Signed)
DATE OR SURGERY: 12/13/11 PAIN:Yes c/o cramping  VAG BLEEDING: no VAG DISCHARGE: no NORMAL GI FUNCTN: yes NORMAL GU FUNCTN: yes  Entered by [employer]   Subjective:     [name] is a 31 y.o. female who presents 2 weeks status post cold knife conization of the cervix for abnormal pap smears.  The following portions of the patient's history were reviewed and updated as appropriate: allergies, current medications, past family history, past medical history, past social history, past surgical history and problem list.  Review of Systems Pertinent items are noted in HPI.   Objective:    BP 128/78  Temp 98.3 F (36.8 C)  Ht 5\' 3"  (1.6 m)  Wt 214 lb (97.07 kg)  BMI 37.91 kg/m2  LMP 11/28/2011 Weight:  Wt Readings from Last 1 Encounters:  12/30/11 214 lb (97.07 kg)    BMI: Body mass index is 37.91 kg/(m^2).  General Appearance: Alert, appropriate appearance for age. No acute distress Lungs: clear to auscultation bilaterally Back: No CVA tenderness Cardiovascular: Regular rate and rhythm. S1, S2, no murmur Gastrointestinal: Soft, non-tender, no masses or organomegaly Incision/s: healing well Pelvic Exam: cervix healing well   Bimanual exam normal  Assessment:    Doing well postoperatively. Operative findings again reviewed. Pathology report discussed.  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    Plan:     1. Continue any current medications. 3. Activity restrictions: No intercourse for 2 more weeks 4. Anticipated return to work: already back at work. 5. Follow up: 4 months for repeat pap smear.   Wes Lezotte P MD 4/22/20135:29 PM

## 2012-05-21 ENCOUNTER — Encounter (HOSPITAL_COMMUNITY): Payer: Self-pay | Admitting: Emergency Medicine

## 2012-05-21 ENCOUNTER — Emergency Department (HOSPITAL_COMMUNITY)
Admission: EM | Admit: 2012-05-21 | Discharge: 2012-05-21 | Disposition: A | Discharge status: Home / Self Care | Payer: Medicaid Other | Source: Home / Self Care | Attending: Emergency Medicine | Admitting: Emergency Medicine

## 2012-05-21 DIAGNOSIS — R059 Cough, unspecified: Secondary | ICD-10-CM

## 2012-05-21 DIAGNOSIS — J069 Acute upper respiratory infection, unspecified: Secondary | ICD-10-CM

## 2012-05-21 DIAGNOSIS — R05 Cough: Secondary | ICD-10-CM

## 2012-05-21 MED ORDER — PREDNISONE 20 MG PO TABS: 40.0000 mg | ORAL_TABLET | Freq: Every day | ORAL | Status: AC

## 2012-05-21 MED ORDER — ALBUTEROL SULFATE HFA 108 (90 BASE) MCG/ACT IN AERS: 1.0000 | INHALATION_SPRAY | Freq: Four times a day (QID) | RESPIRATORY_TRACT | Status: DC | PRN

## 2012-05-21 MED ORDER — AZITHROMYCIN 250 MG PO TABS: 250.0000 mg | ORAL_TABLET | Freq: Every day | ORAL | Status: AC

## 2012-05-21 NOTE — ED Notes (Signed)
C/o of cough and cold since Monday- daughter dx w bronchitis

## 2012-05-21 NOTE — ED Provider Notes (Signed)
History     CSN: [id number]  Arrival date & time 05/21/12  1343   First MD Initiated Contact with Patient 05/21/12 1416      Chief Complaint  Patient presents with  . Cough  . URI    (Consider location/radiation/quality/duration/timing/severity/associated sxs/prior treatment) HPI Comments: Patient presents to the urgent care this afternoon complaining of coughing for 4 days with nasal congestion or runny nose. Her chest is sore from coughing and feels like she is burning. Patient describes that her eldest daughter have been having respiratory symptoms and she was diagnosed with bronchitis but she still coughing a lot and with a open quotations croupy sounding cough. She was prescribed azithromycin at her pediatrician's office. She feels that she has acquired the same infection as her daughters. Patient denies any shortness of breath, wheezing or abdominal pain.  Patient is a 31 y.o. female presenting with cough and URI. The history is provided by the patient.  Cough This is a new problem. The current episode started more than 2 days ago. The problem occurs constantly. The problem has not changed since onset.The cough is non-productive. Associated symptoms include chills, rhinorrhea and sore throat. Pertinent negatives include no chest pain, no ear congestion, no ear pain, no myalgias, no shortness of breath and no wheezing. She is not a smoker. Her past medical history does not include COPD or asthma.  URI The primary symptoms include sore throat and cough. Primary symptoms do not include fever, fatigue, ear pain, wheezing or myalgias.  Symptoms associated with the illness include chills, congestion and rhinorrhea.    Past Medical History  Diagnosis Date  . Anxiety   . PIH (pregnancy induced hypertension)     hx 3 yrs ag0 - no problems since delivery  . Headache     otc meds prn - last one 2 wks ago  . Abnormal Pap smear     Past Surgical History  Procedure Date  .  Cholecystectomy 2004  . Wisdom tooth extraction   . Svd     x 2  . Colonoscopy   . Upper gastrointestinal endoscopy   . Cervical conization w/bx 12/13/2011    Procedure: CONIZATION CERVIX WITH BIOPSY;  Surgeon: Hal Morales, MD;  Location: [facility];  Service: Gynecology;  Laterality: N/A;  Cold Knife    No family history on file.  History  Substance Use Topics  . Smoking status: Former Smoker -- 0.5 packs/day for 3 years    Types: Cigarettes  . Smokeless tobacco: Never Used  . Alcohol Use: Yes     social    OB History    Grav Para Term Preterm Abortions TAB SAB Ect Mult Living                  Review of Systems  Constitutional: Positive for chills. Negative for fever, diaphoresis, activity change and fatigue.  HENT: Positive for congestion, sore throat and rhinorrhea. Negative for ear pain, neck pain and neck stiffness.   Respiratory: Positive for cough. Negative for chest tightness, shortness of breath and wheezing.   Cardiovascular: Negative for chest pain.  Musculoskeletal: Negative for myalgias.  Neurological: Negative for dizziness.    Allergies  Sulfa antibiotics and Amoxicillin  Home Medications   Current Outpatient Rx  Name Route Sig Dispense Refill  . ACETAMINOPHEN 500 MG PO TABS Oral Take 500 mg by mouth every 6 (six) hours as needed.    . ALBUTEROL SULFATE HFA 108 (90 BASE) MCG/ACT IN AERS Inhalation  Inhale 1-2 puffs into the lungs every 6 (six) hours as needed for wheezing. 1 Inhaler 0  . AZITHROMYCIN 250 MG PO TABS Oral Take 1 tablet (250 mg total) by mouth daily. Take first 2 tablets together, then 1 every day until finished. 6 tablet 0  . CLONAZEPAM 1 MG PO TABS Oral Take 1 mg by mouth 3 (three) times daily as needed. For anxiety     . NORGESTIMATE-ETH ESTRADIOL 0.25-35 MG-MCG PO TABS Oral Take 1 tablet by mouth daily.    Marland Kitchen PREDNISONE 20 MG PO TABS Oral Take 2 tablets (40 mg total) by mouth daily. 2 tablets daily for 5 days 10 tablet 0    BP 129/81   Pulse 96  Temp 98.3 F (36.8 C) (Oral)  Resp 18  SpO2 97%  LMP 05/11/2012  Physical Exam  Nursing note and vitals reviewed. Constitutional: Vital signs are normal. She appears well-developed and well-nourished.  Non-toxic appearance. She does not have a sickly appearance. She does not appear ill. No distress.  Eyes: Conjunctivae normal are normal. Right eye exhibits no discharge. Left eye exhibits no discharge.  Cardiovascular: Normal rate.  Exam reveals no gallop and no friction rub.   No murmur heard. Pulmonary/Chest: Effort normal and breath sounds normal. No respiratory distress. She has no decreased breath sounds. She has no wheezes. She has no rhonchi. She has no rales. She exhibits no tenderness.       Frequent coughing episodes mainly dry sounding cough  Abdominal: Soft. She exhibits no distension and no mass. There is no tenderness. There is no rebound and no guarding.  Musculoskeletal: She exhibits no tenderness.  Neurological: She is alert.  Skin: No rash noted. No erythema.    ED Course  Procedures (including critical care time)  Labs Reviewed - No data to display No results found.   1. Cough   2. Upper respiratory infection       MDM  Patient with upper respiratory symptoms consistent with a viral infection. Oldest daughter with similar symptoms. Have advised patient to use albuterol every 6-8 hours along with prednisone and if no improvement after 5 days start with antibiotics. Patient is in no respiratory distress and looks comfortable with a reactive cough.   Jimmie Molly, MD 05/21/12 [phone]

## 2012-06-10 ENCOUNTER — Emergency Department (HOSPITAL_COMMUNITY)
Admission: EM | Admit: 2012-06-10 | Discharge: 2012-06-11 | Disposition: A | Discharge status: Home / Self Care | Payer: Medicaid Other | Attending: Emergency Medicine | Admitting: Emergency Medicine

## 2012-06-10 ENCOUNTER — Encounter (HOSPITAL_COMMUNITY): Payer: Self-pay | Admitting: Emergency Medicine

## 2012-06-10 DIAGNOSIS — N390 Urinary tract infection, site not specified: Secondary | ICD-10-CM | POA: Insufficient documentation

## 2012-06-10 DIAGNOSIS — I10 Essential (primary) hypertension: Secondary | ICD-10-CM | POA: Insufficient documentation

## 2012-06-10 LAB — URINALYSIS, ROUTINE W REFLEX MICROSCOPIC
Glucose, UA: NEGATIVE mg/dL
Ketones, ur: 15 mg/dL — AB
Protein, ur: 100 mg/dL — AB
Urobilinogen, UA: 1 mg/dL (ref 0.0–1.0)

## 2012-06-10 LAB — URINE MICROSCOPIC-ADD ON

## 2012-06-10 LAB — POCT PREGNANCY, URINE: Preg Test, Ur: NEGATIVE

## 2012-06-10 MED ORDER — OXYCODONE-ACETAMINOPHEN 5-325 MG PO TABS
1.0000 | ORAL_TABLET | Freq: Once | ORAL | Status: AC
  Administered 2012-06-10: 1 via ORAL
  Filled 2012-06-10: qty 1

## 2012-06-10 NOTE — ED Notes (Signed)
Pt reports since Monday, having urinary frequency, dysuria, and lower back pain; has started taking azo pills

## 2012-06-11 MED ORDER — CIPROFLOXACIN HCL 500 MG PO TABS
500.0000 mg | ORAL_TABLET | Freq: Once | ORAL | Status: AC
  Administered 2012-06-11: 500 mg via ORAL
  Filled 2012-06-11: qty 1

## 2012-06-11 MED ORDER — CIPROFLOXACIN HCL 500 MG PO TABS: 500.0000 mg | ORAL_TABLET | Freq: Two times a day (BID) | ORAL | Status: DC

## 2012-06-11 MED ORDER — NAPROXEN 500 MG PO TABS: 500.0000 mg | ORAL_TABLET | Freq: Two times a day (BID) | ORAL | Status: DC

## 2012-06-11 NOTE — ED Provider Notes (Signed)
History     CSN: [id number]  Arrival date & time 06/10/12  2229   First MD Initiated Contact with Patient 06/11/12 0009      Chief Complaint  Patient presents with  . Urinary Tract Infection    (Consider location/radiation/quality/duration/timing/severity/associated sxs/prior treatment) HPI Comments: Hx of chole, hx of UTI - and has had 2 days of urinary frequency, gradually worsening, now with low back pain, assocaited chills but no f/n/v.  No diarrhea.l  Sx are moderate.  Patient is a 31 y.o. female presenting with urinary tract infection. The history is provided by the patient.  Urinary Tract Infection    Past Medical History  Diagnosis Date  . Anxiety   . PIH (pregnancy induced hypertension)     hx 3 yrs ag0 - no problems since delivery  . Headache     otc meds prn - last one 2 wks ago  . Abnormal Pap smear     Past Surgical History  Procedure Date  . Cholecystectomy 2004  . Wisdom tooth extraction   . Svd     x 2  . Colonoscopy   . Upper gastrointestinal endoscopy   . Cervical conization w/bx 12/13/2011    Procedure: CONIZATION CERVIX WITH BIOPSY;  Surgeon: Hal Morales, MD;  Location: [facility];  Service: Gynecology;  Laterality: N/A;  Cold Knife    History reviewed. No pertinent family history.  History  Substance Use Topics  . Smoking status: Former Smoker -- 0.5 packs/day for 3 years    Types: Cigarettes  . Smokeless tobacco: Never Used   Comment: quit 2005  . Alcohol Use: Yes     social    OB History    Grav Para Term Preterm Abortions TAB SAB Ect Mult Living                  Review of Systems  Constitutional: Positive for chills. Negative for fever.  Gastrointestinal: Negative for nausea and vomiting.    Allergies  Sulfa antibiotics and Amoxicillin  Home Medications   Current Outpatient Rx  Name Route Sig Dispense Refill  . ACETAMINOPHEN 500 MG PO TABS Oral Take 500 mg by mouth every 6 (six) hours as needed.    Suzzanne Cloud  ESTRADIOL 0.25-35 MG-MCG PO TABS Oral Take 1 tablet by mouth daily.    Marland Kitchen CIPROFLOXACIN HCL 500 MG PO TABS Oral Take 1 tablet (500 mg total) by mouth 2 (two) times daily. 14 tablet 0  . NAPROXEN 500 MG PO TABS Oral Take 1 tablet (500 mg total) by mouth 2 (two) times daily with a meal. 30 tablet 0    BP 158/90  Pulse 95  Temp 98.1 F (36.7 C) (Oral)  Resp 20  SpO2 94%  LMP 05/11/2012  Physical Exam  Nursing note and vitals reviewed. Constitutional: She appears well-developed and well-nourished. No distress.  HENT:  Head: Normocephalic and atraumatic.  Mouth/Throat: Oropharynx is clear and moist.  Eyes: Conjunctivae normal are normal. No scleral icterus.  Cardiovascular: Normal rate and regular rhythm.   Pulmonary/Chest: Effort normal and breath sounds normal.  Abdominal:       Mild to moderate tenderness in the suprapubic area, no pain at McBurney's, no CVA tenderness, no peritoneal signs, no upper abdominal tenderness  Neurological:       Gait and speech are normal  Skin: Skin is warm and dry. No rash noted.    ED Course  Procedures (including critical care time)  Labs Reviewed  URINALYSIS, ROUTINE  W REFLEX MICROSCOPIC - Abnormal; Notable for the following:    Color, Urine ORANGE (*)  BIOCHEMICALS MAY BE AFFECTED BY COLOR   APPearance CLOUDY (*)     Hgb urine dipstick LARGE (*)     Bilirubin Urine SMALL (*)     Ketones, ur 15 (*)     Protein, ur 100 (*)     Nitrite POSITIVE (*)     Leukocytes, UA SMALL (*)     All other components within normal limits  URINE MICROSCOPIC-ADD ON - Abnormal; Notable for the following:    Squamous Epithelial / LPF MANY (*)     Bacteria, UA MANY (*)     All other components within normal limits  POCT PREGNANCY, URINE  URINE CULTURE   No results found.   1. Urinary tract infection       MDM  Overall the patient is well-appearing, she has normal vital signs including no hypotension, no fever, no tachycardia, she is a urine sample  which was positive for urinary tract infection with positive nitrite, it does appear to have lots of squamous cells and is a urine culture was sent, Cipro started, patient appears stable for discharge. Discharged with ciprofloxacin x7 days and Naprosyn.        Vida Roller, MD 06/11/12 [phone]

## 2012-06-12 LAB — URINE CULTURE: Colony Count: 75000

## 2012-08-04 ENCOUNTER — Other Ambulatory Visit: Payer: Self-pay | Admitting: Obstetrics and Gynecology

## 2012-08-04 NOTE — Telephone Encounter (Signed)
Lm on vm tcb rgd msg 

## 2012-08-05 MED ORDER — NORGESTIMATE-ETH ESTRADIOL 0.25-35 MG-MCG PO TABS: 1.0000 | ORAL_TABLET | Freq: Every day | ORAL | Status: DC

## 2012-08-05 NOTE — Telephone Encounter (Signed)
Spoke with pt informed rx sent to pharm per protocol pt voice understanding

## 2012-10-06 ENCOUNTER — Encounter: Payer: Self-pay | Admitting: Obstetrics and Gynecology

## 2012-10-06 ENCOUNTER — Ambulatory Visit: Payer: Medicaid Other | Admitting: Obstetrics and Gynecology

## 2012-10-06 VITALS — BP 118/78 | Ht 63.0 in | Wt 212.0 lb

## 2012-10-06 DIAGNOSIS — Z01419 Encounter for gynecological examination (general) (routine) without abnormal findings: Secondary | ICD-10-CM

## 2012-10-06 DIAGNOSIS — N926 Irregular menstruation, unspecified: Secondary | ICD-10-CM

## 2012-10-06 DIAGNOSIS — R87613 High grade squamous intraepithelial lesion on cytologic smear of cervix (HGSIL): Secondary | ICD-10-CM

## 2012-10-06 MED ORDER — NORETHINDRONE-ETH ESTRADIOL 0.4-35 MG-MCG PO TABS: 1.0000 | ORAL_TABLET | Freq: Every day | ORAL | Status: DC

## 2012-10-06 NOTE — Progress Notes (Signed)
Last Pap: 09/16/2011 WNL: No Regular Periods:no Contraception: sprintec   Monthly Breast exam:yes Tetanus<83yrs:yes Nl.Bladder Function:yes Daily BMs:yes Healthy Diet:yes Calcium:yes Mammogram:no Exercise:yes Have often Exercise: 3 days a week  Seatbelt: yes Abuse at home: no Stressful work:yes PCP: Dr.Williams Change in PMH: unchanged Change in WUJ:[id number]  C/o irregular cycles with the pill.

## 2012-10-06 NOTE — Progress Notes (Signed)
Subjective:    [name] is a 32 y.o. female, G2P2002, who presents for an annual exam.   Patient reports:  Doing well.  Has irregular cycles on Sprintec, with short cycles--but occur somewhat irregularly since CKC in April of 2013.  Had HGSIL. Was to have had repeat pap in 8/13, but did not f/u.  No other issues.   History   Social History  . Marital Status: Divorced    Spouse Name: N/A    Number of Children: N/A  . Years of Education: N/A   Social History Main Topics  . Smoking status: Former Smoker -- 0.5 packs/day for 3 years    Types: Cigarettes  . Smokeless tobacco: Never Used     Comment: quit 2005  . Alcohol Use: Yes     Comment: social  . Drug Use: No  . Sexually Active: Yes    Birth Control/ Protection: Pill   Other Topics Concern  . None   Social History Narrative  . None    Menstrual cycle:   LMP: Patient's last menstrual period was 09/27/2012.           Cycle: Irregular on Sprintec, but short  The following portions of the patient's history were reviewed and updated as appropriate: allergies, current medications, past family history, past medical history, past social history, past surgical history and problem list.  Review of Systems Pertinent items are noted in HPI. Breast:Negative for breast lump,nipple discharge or nipple retraction Gastrointestinal: Negative for abdominal pain, change in bowel habits or rectal bleeding Urinary:negative   Objective:    BP 118/78  Ht 5\' 3"  (1.6 m)  Wt 212 lb (96.163 kg)  BMI 37.55 kg/m2  LMP 09/27/2012    Weight:  Wt Readings from Last 1 Encounters:  10/06/12 212 lb (96.163 kg)          BMI: Body mass index is 37.55 kg/(m^2).  General Appearance: Alert, appropriate appearance for age. No acute distress HEENT: Grossly normal Neck / Thyroid: Supple, no masses, nodes or enlargement Lungs: clear to auscultation bilaterally Back: No CVA tenderness Breast Exam: No masses or nodes.No dimpling, nipple  retraction or discharge. Cardiovascular: Regular rate and rhythm. S1, S2, no murmur Gastrointestinal: Soft, non-tender, no masses or organomegaly Pelvic Exam: Vulva and vagina appear normal. Bimanual exam reveals normal uterus and adnexa. Rectovaginal: not indicated Lymphatic Exam: Non-palpable nodes in neck, clavicular, axillary, or inguinal regions Skin: no rash or abnormalities Neurologic: Normal gait and speech, no tremor  Psychiatric: Alert and oriented, appropriate affect.   Wet Prep:not applicable Urinalysis:not applicable UPT: Not done   Assessment:    Normal gyn exam  Hx HGSIL, with CKC in 12/2011 with [facility]--no f/u Irregular cycles on OCPs   Plan:    Mammogram: Age 22 Pap:  Done today--will f/u based on result STD screening: declined Contraception:oral contraceptives (estrogen/progesterone), Sprintec Other:  Per patient request, will change OCPs in an attempt to stop irregularity of cycles.   Will Rx Ovcon 1/35 x 3 months for trial.      Nyra Capes, [place]

## 2012-10-07 LAB — PAP IG W/ RFLX HPV ASCU

## 2012-10-09 ENCOUNTER — Encounter: Payer: Self-pay | Admitting: Obstetrics and Gynecology

## 2012-10-12 ENCOUNTER — Encounter: Payer: Self-pay | Admitting: Obstetrics and Gynecology

## 2013-02-07 ENCOUNTER — Emergency Department (HOSPITAL_COMMUNITY)
Admission: EM | Admit: 2013-02-07 | Discharge: 2013-02-07 | Disposition: A | Discharge status: Home / Self Care | Payer: Medicaid Other | Source: Home / Self Care

## 2013-02-07 ENCOUNTER — Encounter (HOSPITAL_COMMUNITY): Payer: Self-pay | Admitting: Emergency Medicine

## 2013-02-07 DIAGNOSIS — N390 Urinary tract infection, site not specified: Secondary | ICD-10-CM

## 2013-02-07 LAB — POCT URINALYSIS DIP (DEVICE)
Bilirubin Urine: NEGATIVE
Glucose, UA: NEGATIVE mg/dL
Ketones, ur: NEGATIVE mg/dL
Nitrite: POSITIVE — AB
Specific Gravity, Urine: 1.015 (ref 1.005–1.030)

## 2013-02-07 MED ORDER — CIPROFLOXACIN HCL 500 MG PO TABS: 500.0000 mg | ORAL_TABLET | Freq: Two times a day (BID) | ORAL | Status: DC

## 2013-02-07 NOTE — ED Notes (Signed)
Pt c/o frequency, incontence, and pain with urinating since Thursday.  Denies fever and nausea. Hx of recurrent utis.  Pt has tried otc meds with no relief.

## 2013-02-07 NOTE — ED Provider Notes (Signed)
History     CSN: [id number]  Arrival date & time 02/07/13  1652   None     Chief Complaint  Patient presents with  . Urinary Tract Infection    frequency and pain with urinating. since thursday.     (Consider location/radiation/quality/duration/timing/severity/associated sxs/prior treatment) Patient is a 32 y.o. female presenting with urinary tract infection. The history is provided by the patient.  Urinary Tract Infection This is a new problem. The current episode started 3 to 5 hours ago. The problem has been gradually worsening. Pertinent negatives include no chest pain and no abdominal pain.    Past Medical History  Diagnosis Date  . Anxiety   . PIH (pregnancy induced hypertension)     hx 3 yrs ag0 - no problems since delivery  . Headache(784.0)     otc meds prn - last one 2 wks ago  . Abnormal Pap smear     Past Surgical History  Procedure Laterality Date  . Cholecystectomy  2004  . Wisdom tooth extraction    . Svd      x 2  . Colonoscopy    . Upper gastrointestinal endoscopy    . Cervical conization w/bx  12/13/2011    Procedure: CONIZATION CERVIX WITH BIOPSY;  Surgeon: Hal Morales, MD;  Location: [facility];  Service: Gynecology;  Laterality: N/A;  Cold Knife    History reviewed. No pertinent family history.  History  Substance Use Topics  . Smoking status: Former Smoker -- 0.50 packs/day for 3 years    Types: Cigarettes  . Smokeless tobacco: Never Used     Comment: quit 2005  . Alcohol Use: Yes     Comment: social    OB History   Grav Para Term Preterm Abortions TAB SAB Ect Mult Living   2 2 2       2       Review of Systems  Constitutional: Negative.   Cardiovascular: Negative for chest pain.  Gastrointestinal: Negative.  Negative for abdominal pain.  Genitourinary: Positive for dysuria, urgency and frequency. Negative for vaginal bleeding and vaginal discharge.    Allergies  Sulfa antibiotics and Amoxicillin  Home Medications   Current  Outpatient Rx  Name  Route  Sig  Dispense  Refill  . clonazePAM (KLONOPIN) 1 MG tablet   Oral   Take 1 mg by mouth 2 (two) times daily as needed.         . norethindrone-ethinyl estradiol (OVCON-35, 28,) 0.4-35 MG-MCG tablet   Oral   Take 1 tablet by mouth daily.   1 Package   11   . acetaminophen (TYLENOL) 500 MG tablet   Oral   Take 500 mg by mouth every 6 (six) hours as needed.         . ciprofloxacin (CIPRO) 500 MG tablet   Oral   Take 1 tablet (500 mg total) by mouth 2 (two) times daily.   14 tablet   0   . ciprofloxacin (CIPRO) 500 MG tablet   Oral   Take 1 tablet (500 mg total) by mouth 2 (two) times daily.   10 tablet   0   . naproxen (NAPROSYN) 500 MG tablet   Oral   Take 1 tablet (500 mg total) by mouth 2 (two) times daily with a meal.   30 tablet   0   . norgestimate-ethinyl estradiol (SPRINTEC 28) 0.25-35 MG-MCG tablet   Oral   Take 1 tablet by mouth daily.   1 Package  0     BP 158/92  Pulse 94  Temp(Src) 98.3 F (36.8 C) (Oral)  Resp 18  SpO2 98%  LMP 01/29/2013  Physical Exam  Nursing note and vitals reviewed. Constitutional: She is oriented to person, place, and time. She appears well-developed and well-nourished.  Abdominal: Soft. Bowel sounds are normal. She exhibits no distension and no mass. There is no tenderness. There is no rebound and no guarding.  Neurological: She is alert and oriented to person, place, and time.  Skin: Skin is warm and dry.    ED Course  Procedures (including critical care time)  Labs Reviewed  POCT URINALYSIS DIP (DEVICE) - Abnormal; Notable for the following:    Hgb urine dipstick TRACE (*)    Nitrite POSITIVE (*)    All other components within normal limits   No results found.   1. UTI (lower urinary tract infection)       MDM          Linna Hoff, MD 02/07/13 [phone]

## 2013-06-01 ENCOUNTER — Emergency Department (HOSPITAL_COMMUNITY)
Admission: EM | Admit: 2013-06-01 | Discharge: 2013-06-01 | Disposition: A | Discharge status: Home / Self Care | Payer: Medicaid Other | Attending: Emergency Medicine | Admitting: Emergency Medicine

## 2013-06-01 ENCOUNTER — Emergency Department (HOSPITAL_COMMUNITY): Admission: EM | Admit: 2013-06-01 | Discharge: 2013-06-01 | Discharge status: Absent without leave | Payer: Self-pay

## 2013-06-01 ENCOUNTER — Emergency Department (HOSPITAL_COMMUNITY): Payer: Medicaid Other

## 2013-06-01 DIAGNOSIS — Y929 Unspecified place or not applicable: Secondary | ICD-10-CM | POA: Insufficient documentation

## 2013-06-01 DIAGNOSIS — Z79899 Other long term (current) drug therapy: Secondary | ICD-10-CM | POA: Insufficient documentation

## 2013-06-01 DIAGNOSIS — S8990XA Unspecified injury of unspecified lower leg, initial encounter: Secondary | ICD-10-CM | POA: Insufficient documentation

## 2013-06-01 DIAGNOSIS — Y9389 Activity, other specified: Secondary | ICD-10-CM | POA: Insufficient documentation

## 2013-06-01 DIAGNOSIS — Z87891 Personal history of nicotine dependence: Secondary | ICD-10-CM | POA: Insufficient documentation

## 2013-06-01 DIAGNOSIS — F411 Generalized anxiety disorder: Secondary | ICD-10-CM | POA: Insufficient documentation

## 2013-06-01 DIAGNOSIS — R296 Repeated falls: Secondary | ICD-10-CM | POA: Insufficient documentation

## 2013-06-01 DIAGNOSIS — M79671 Pain in right foot: Secondary | ICD-10-CM

## 2013-06-01 DIAGNOSIS — Z792 Long term (current) use of antibiotics: Secondary | ICD-10-CM | POA: Insufficient documentation

## 2013-06-01 DIAGNOSIS — M25561 Pain in right knee: Secondary | ICD-10-CM

## 2013-06-01 MED ORDER — HYDROCODONE-ACETAMINOPHEN 5-325 MG PO TABS: 1.0000 | ORAL_TABLET | Freq: Four times a day (QID) | ORAL | Status: DC | PRN

## 2013-06-01 NOTE — ED Notes (Signed)
Pt discharged.Vital signs stable and GCS 15 

## 2013-06-01 NOTE — ED Provider Notes (Signed)
CSN: [id number]     Arrival date & time 06/01/13  1919 History  This chart was scribed for Felicie Morn, NP, working with Lynford Citizen, by Allene Dillon, ED Scribe. This patient was seen in room TR07C/TR07C and the patient's care was started at 8:53 PM.  Chief Complaint  Patient presents with  . Knee Pain  . Ankle Pain    Patient is a 32 y.o. female presenting with knee pain. The history is provided by the patient. No language interpreter was used.  Knee Pain Location:  Knee Injury: yes (Twisting injury)   Mechanism of injury: fall   Fall:    Fall occurred:  Walking and from an Educational psychologist   Impact surface:  Concrete   Entrapped after fall: no   Knee location:  R knee Pain details:    Onset quality:  Sudden   Timing:  Constant   Progression:  Unchanged Chronicity:  New Dislocation: no   Foreign body present:  No foreign bodies Prior injury to area:  No Relieved by:  None tried Worsened by:  Bearing weight Ineffective treatments:  None tried (Ibuprofen) Associated symptoms: no back pain, no fever, no neck pain, no numbness and no tingling    HPI Comments: [name] is a 32 y.o. female who presents to the Emergency Department complaining of sudden onset, constant, moderate right knee pain. Pt also states she has pain in her right ankle. Pt reports she was walking her dog and lost control of dog and fell causing pain.  Pt took ibuprofen with no relief. Pt is able to bare weight with pain. Pt states she has not seen an orthopedist in the past. Pt denies numbness, LOC, head injury or any other symptoms.    Past Medical History  Diagnosis Date  . Anxiety   . PIH (pregnancy induced hypertension)     hx 3 yrs ag0 - no problems since delivery  . Headache(784.0)     otc meds prn - last one 2 wks ago  . Abnormal Pap smear    Past Surgical History  Procedure Laterality Date  . Cholecystectomy  2004  . Wisdom tooth extraction    . Svd      x 2  . Colonoscopy    . Upper  gastrointestinal endoscopy    . Cervical conization w/bx  12/13/2011    Procedure: CONIZATION CERVIX WITH BIOPSY;  Surgeon: Hal Morales, MD;  Location: [facility];  Service: Gynecology;  Laterality: N/A;  Cold Knife   No family history on file. History  Substance Use Topics  . Smoking status: Former Smoker -- 0.50 packs/day for 3 years    Types: Cigarettes  . Smokeless tobacco: Never Used     Comment: quit 2005  . Alcohol Use: Yes     Comment: social   OB History   Grav Para Term Preterm Abortions TAB SAB Ect Mult Living   2 2 2       2      Review of Systems  Constitutional: Negative for fever and chills.  HENT: Negative for neck pain.   Musculoskeletal: Positive for arthralgias (Right knee and ankle) and gait problem. Negative for back pain.  Neurological: Negative for syncope and numbness.  All other systems reviewed and are negative.   Allergies  Sulfa antibiotics and Amoxicillin  Home Medications   Current Outpatient Rx  Name  Route  Sig  Dispense  Refill  . acetaminophen (TYLENOL) 500 MG tablet   Oral  Take 500 mg by mouth every 6 (six) hours as needed.         . clonazePAM (KLONOPIN) 1 MG tablet   Oral   Take 1 mg by mouth 2 (two) times daily as needed.         Marland Kitchen ibuprofen (ADVIL,MOTRIN) 200 MG tablet   Oral   Take 400 mg by mouth every 6 (six) hours as needed for pain.         . naproxen (NAPROSYN) 500 MG tablet   Oral   Take 1 tablet (500 mg total) by mouth 2 (two) times daily with a meal.   30 tablet   0   . norgestimate-ethinyl estradiol (SPRINTEC 28) 0.25-35 MG-MCG tablet   Oral   Take 1 tablet by mouth daily.   1 Package   0   . ciprofloxacin (CIPRO) 500 MG tablet   Oral   Take 1 tablet (500 mg total) by mouth 2 (two) times daily.   14 tablet   0    Triage Vitals: BP 152/103  Pulse 104  Temp(Src) 98.4 F (36.9 C) (Oral)  Resp 16  SpO2 99%  LMP 05/23/2013  Physical Exam  Nursing note and vitals reviewed. Constitutional: She  is oriented to person, place, and time. She appears well-developed and well-nourished. No distress.  HENT:  Head: Normocephalic.  Eyes: EOM are normal.  Neck: Normal range of motion.  Cardiovascular: Normal rate, regular rhythm and normal heart sounds.   Pulmonary/Chest: Effort normal and breath sounds normal. No respiratory distress.  Abdominal: Soft. She exhibits no distension.  Musculoskeletal: Normal range of motion.  Right knee and forefoot tenderness. No obvious deformity or swelling.   Neurological: She is alert and oriented to person, place, and time.  Neurologically intact.  Skin: Skin is warm and dry.  Psychiatric: She has a normal mood and affect.    ED Course  Procedures (including critical care time)  DIAGNOSTIC STUDIES: Oxygen Saturation is 99% on RA, normal by my interpretation.    COORDINATION OF CARE: 9:00 PM-  Advised to see PCP if pain worsens and to ice affected area 3-4x a day. Pt advised of plan for treatment including ace wrap, crutches and pt agrees.  Labs Review Labs Reviewed - No data to display Imaging Review Dg Ankle Complete Right  06/01/2013   CLINICAL DATA:  Tripped and fell earlier today, twisting the right knee and the right ankle.  EXAM: RIGHT ANKLE - COMPLETE 3+ VIEW  COMPARISON:  None.  FINDINGS: No evidence of acute fracture or dislocation. Ankle mortise intact with well preserved joint space. Well preserved bone mineral density. No intrinsic osseous abnormality. No visible joint effusion. Dorsal soft tissue swelling.  IMPRESSION: No osseous abnormality.   Electronically Signed   By: Hulan Saas   On: 06/01/2013 20:11   Dg Knee Complete 4 Views Right  06/01/2013   CLINICAL DATA:  Tripped and fell earlier today, twisting the right knee and the right ankle.  EXAM: RIGHT KNEE - COMPLETE 4+ VIEW  COMPARISON:  09/19/2010.  FINDINGS: No evidence of acute fracture or dislocation. Well preserved joint spaces. Well preserved bone mineral density. No  intrinsic osseous abnormality. No visible joint effusion.  IMPRESSION: Normal examination.   Electronically Signed   By: Hulan Saas   On: 06/01/2013 20:12   Radiology results reviewed and shared with patient. MDM   1. Knee pain, acute, right   2. Right foot pain    I personally performed the services  described in this documentation, which was scribed in my presence. The recorded information has been reviewed and is accurate.    Jimmye Norman, NP 06/01/13 [phone]

## 2013-06-01 NOTE — ED Notes (Signed)
Pt. reports injury to right knee and right ankle with pain after her dog suddenly pulled her while walking her dog this afternoon . Pt. took Ibuprofen with no relief.

## 2013-06-02 NOTE — ED Provider Notes (Signed)
Medical screening examination/treatment/procedure(s) were performed by non-physician practitioner and as supervising physician I was immediately available for consultation/collaboration.  Austine Kelsay R. Fiza Nation, MD 06/02/13 0019 

## 2013-07-21 ENCOUNTER — Encounter (HOSPITAL_COMMUNITY): Payer: Self-pay | Admitting: Emergency Medicine

## 2013-07-21 ENCOUNTER — Emergency Department (HOSPITAL_COMMUNITY)
Admission: EM | Admit: 2013-07-21 | Discharge: 2013-07-21 | Disposition: A | Discharge status: Home / Self Care | Payer: Medicaid Other | Attending: Emergency Medicine | Admitting: Emergency Medicine

## 2013-07-21 DIAGNOSIS — Z880 Allergy status to penicillin: Secondary | ICD-10-CM | POA: Insufficient documentation

## 2013-07-21 DIAGNOSIS — A599 Trichomoniasis, unspecified: Secondary | ICD-10-CM

## 2013-07-21 DIAGNOSIS — N12 Tubulo-interstitial nephritis, not specified as acute or chronic: Secondary | ICD-10-CM

## 2013-07-21 DIAGNOSIS — Z3202 Encounter for pregnancy test, result negative: Secondary | ICD-10-CM | POA: Insufficient documentation

## 2013-07-21 DIAGNOSIS — A5901 Trichomonal vulvovaginitis: Secondary | ICD-10-CM | POA: Insufficient documentation

## 2013-07-21 DIAGNOSIS — Z87891 Personal history of nicotine dependence: Secondary | ICD-10-CM | POA: Insufficient documentation

## 2013-07-21 DIAGNOSIS — Z8742 Personal history of other diseases of the female genital tract: Secondary | ICD-10-CM | POA: Insufficient documentation

## 2013-07-21 DIAGNOSIS — Z8659 Personal history of other mental and behavioral disorders: Secondary | ICD-10-CM | POA: Insufficient documentation

## 2013-07-21 LAB — URINALYSIS, ROUTINE W REFLEX MICROSCOPIC
Specific Gravity, Urine: 1.03 (ref 1.005–1.030)
Urobilinogen, UA: 0.2 mg/dL (ref 0.0–1.0)
pH: 6 (ref 5.0–8.0)

## 2013-07-21 LAB — URINE MICROSCOPIC-ADD ON

## 2013-07-21 LAB — POCT PREGNANCY, URINE: Preg Test, Ur: NEGATIVE

## 2013-07-21 MED ORDER — METRONIDAZOLE 500 MG PO TABS
2000.0000 mg | ORAL_TABLET | Freq: Once | ORAL | Status: AC
  Administered 2013-07-21: 2000 mg via ORAL
  Filled 2013-07-21: qty 4

## 2013-07-21 MED ORDER — CIPROFLOXACIN HCL 500 MG PO TABS: 500.0000 mg | ORAL_TABLET | Freq: Two times a day (BID) | ORAL | Status: DC

## 2013-07-21 NOTE — ED Provider Notes (Signed)
CSN: [id number]     Arrival date & time 07/21/13  1944 History   First MD Initiated Contact with Patient 07/21/13 2132     Chief Complaint  Patient presents with  . Back Pain  . Dysuria   (Consider location/radiation/quality/duration/timing/severity/associated sxs/prior Treatment) HPI  32 year old obese female presents complaining of low abdominal pain. Patient complaining of gradual onset of crampy to sharp pain to her low abdomen pelvic region which started since yesterday and has been progressively getting worse. Pain is associated with dysuria. Pain does radiates to the back. Pain felt different from prior kidney stones that she is usually able to pass. She did notice specks of blood in her urine. Movement seems to make the pain worse and nothing makes it better. No report of fever, chills, chest pain, shortness of breath, nausea, vomiting, diarrhea. Denies having any vaginal discharge or rash. Patient has normal bowel movement.  Patient reports no prior history of STD, as the same sexual partner for the past 2 years, using protection, and did not have any unusual sexual practices.  Past Medical History  Diagnosis Date  . Anxiety   . PIH (pregnancy induced hypertension)     hx 3 yrs ag0 - no problems since delivery  . Headache(784.0)     otc meds prn - last one 2 wks ago  . Abnormal Pap smear    Past Surgical History  Procedure Laterality Date  . Cholecystectomy  2004  . Wisdom tooth extraction    . Svd      x 2  . Colonoscopy    . Upper gastrointestinal endoscopy    . Cervical conization w/bx  12/13/2011    Procedure: CONIZATION CERVIX WITH BIOPSY;  Surgeon: Hal Morales, MD;  Location: [facility];  Service: Gynecology;  Laterality: N/A;  Cold Knife   No family history on file. History  Substance Use Topics  . Smoking status: Former Smoker -- 0.50 packs/day for 3 years    Types: Cigarettes  . Smokeless tobacco: Never Used     Comment: quit 2005  . Alcohol Use: Yes   Comment: social   OB History   Grav Para Term Preterm Abortions TAB SAB Ect Mult Living   2 2 2       2      Review of Systems  All other systems reviewed and are negative.    Allergies  Sulfa antibiotics and Amoxicillin  Home Medications   Current Outpatient Rx  Name  Route  Sig  Dispense  Refill  . acetaminophen (TYLENOL) 500 MG tablet   Oral   Take 500 mg by mouth every 6 (six) hours as needed.         Marland Kitchen ibuprofen (ADVIL,MOTRIN) 200 MG tablet   Oral   Take 400 mg by mouth every 6 (six) hours as needed for pain.         . norgestimate-ethinyl estradiol (SPRINTEC 28) 0.25-35 MG-MCG tablet   Oral   Take 1 tablet by mouth daily.   1 Package   0    BP 159/103  Pulse 99  Temp(Src) 98.5 F (36.9 C) (Oral)  Resp 14  SpO2 97%  LMP 07/11/2013 Physical Exam  Nursing note and vitals reviewed. Constitutional: She is oriented to person, place, and time. She appears well-developed and well-nourished. No distress.  HENT:  Head: Normocephalic and atraumatic.  Eyes: Conjunctivae are normal.  Neck: Normal range of motion. Neck supple.  Cardiovascular: Normal rate and regular rhythm.   Pulmonary/Chest:  Effort normal and breath sounds normal. She exhibits no tenderness.  Abdominal: Soft. There is tenderness (diffuse low abdominal tenderness on palpation  without guarding or rebound tenderness.  tenderness to lower back bilaterally.  Negative psoas/obturator).  Genitourinary: Cervix exhibits no motion tenderness and no discharge. Right adnexum displays no mass and no tenderness. Left adnexum displays no mass and no tenderness.  Pelvic exam deferred per pt request  Neurological: She is alert and oriented to person, place, and time.  Skin: Skin is warm. No rash noted.  Psychiatric: She has a normal mood and affect.    ED Course  Procedures (including critical care time)  Patient with low abnormal pain and dysuria. UA shows evidence of urinary tract infection. Patient also  has evidence of Trichomonas in her urine. I offer to perform a pelvic exam however patient declined. Patient acknowledged that risk of undiagnosed infection that may have caused the symptoms. Patient agrees to followup closely with the OB/GYN  for further evaluation. At this time I do not suspect obstructive kidney stones as patient has bilateral lower back pain but no significant unilateral CVA tenderness. Do not suspect appendicitis due to clinical presentation.  She is afebrile with stable vital signs.  No N/V.  Return precaution discussed.    Labs Review Labs Reviewed  URINALYSIS, ROUTINE W REFLEX MICROSCOPIC - Abnormal; Notable for the following:    APPearance TURBID (*)    Hgb urine dipstick LARGE (*)    Protein, ur 30 (*)    Leukocytes, UA LARGE (*)    All other components within normal limits  URINE MICROSCOPIC-ADD ON - Abnormal; Notable for the following:    Squamous Epithelial / LPF FEW (*)    Bacteria, UA FEW (*)    All other components within normal limits  URINE CULTURE  WET PREP, GENITAL  GC/CHLAMYDIA PROBE AMP  POCT PREGNANCY, URINE   Imaging Review No results found.  EKG Interpretation   None       MDM   1. Pyelonephritis   2. Trichomonal infection    BP 159/103  Pulse 99  Temp(Src) 98.5 F (36.9 C) (Oral)  Resp 14  SpO2 97%  LMP 07/11/2013     Fayrene Helper, PA-C 07/21/13 2241

## 2013-07-21 NOTE — ED Notes (Signed)
Discharge instructions reviewed with patient  Rx X 1 reviewed with patient Follow up instructions reviewed with patient  Patient verbalized understanding to DC instructions, Rx X 1 and follow up care All questions answered  Patient denied further needs or concerns at this time Patient alert and oriented x 4 upon time of DC and in NAD Patient medicated prior to DC, see [facility]

## 2013-07-21 NOTE — ED Notes (Signed)
Patient refusing pelvic exam PA aware and at bedside

## 2013-07-21 NOTE — ED Notes (Signed)
Pt states she has had lower back pain and dysuria since this morning. Pt states she has a hx of kidney stones. States this pain feels much worse than her kidney stones.

## 2013-07-22 LAB — URINE CULTURE

## 2013-07-26 NOTE — ED Provider Notes (Signed)
Medical screening examination/treatment/procedure(s) were performed by non-physician practitioner and as supervising physician I was immediately available for consultation/collaboration.   Jorgen Wolfinger M Mutasim Tuckey, MD 07/26/13 1416 

## 2013-08-16 ENCOUNTER — Emergency Department (HOSPITAL_COMMUNITY)
Admission: EM | Admit: 2013-08-16 | Discharge: 2013-08-16 | Disposition: A | Discharge status: Home / Self Care | Payer: Medicaid Other | Attending: Emergency Medicine | Admitting: Emergency Medicine

## 2013-08-16 ENCOUNTER — Encounter (HOSPITAL_COMMUNITY): Payer: Self-pay | Admitting: Emergency Medicine

## 2013-08-16 ENCOUNTER — Emergency Department (HOSPITAL_COMMUNITY): Payer: Medicaid Other

## 2013-08-16 DIAGNOSIS — Y9301 Activity, walking, marching and hiking: Secondary | ICD-10-CM | POA: Insufficient documentation

## 2013-08-16 DIAGNOSIS — S93401A Sprain of unspecified ligament of right ankle, initial encounter: Secondary | ICD-10-CM

## 2013-08-16 DIAGNOSIS — Y990 Civilian activity done for income or pay: Secondary | ICD-10-CM | POA: Insufficient documentation

## 2013-08-16 DIAGNOSIS — F411 Generalized anxiety disorder: Secondary | ICD-10-CM | POA: Insufficient documentation

## 2013-08-16 DIAGNOSIS — Z8669 Personal history of other diseases of the nervous system and sense organs: Secondary | ICD-10-CM | POA: Insufficient documentation

## 2013-08-16 DIAGNOSIS — Z882 Allergy status to sulfonamides status: Secondary | ICD-10-CM | POA: Insufficient documentation

## 2013-08-16 DIAGNOSIS — S93409A Sprain of unspecified ligament of unspecified ankle, initial encounter: Secondary | ICD-10-CM | POA: Insufficient documentation

## 2013-08-16 DIAGNOSIS — Z8742 Personal history of other diseases of the female genital tract: Secondary | ICD-10-CM | POA: Insufficient documentation

## 2013-08-16 DIAGNOSIS — Z881 Allergy status to other antibiotic agents status: Secondary | ICD-10-CM | POA: Insufficient documentation

## 2013-08-16 DIAGNOSIS — Y929 Unspecified place or not applicable: Secondary | ICD-10-CM | POA: Insufficient documentation

## 2013-08-16 DIAGNOSIS — Z79899 Other long term (current) drug therapy: Secondary | ICD-10-CM | POA: Insufficient documentation

## 2013-08-16 DIAGNOSIS — X500XXA Overexertion from strenuous movement or load, initial encounter: Secondary | ICD-10-CM | POA: Insufficient documentation

## 2013-08-16 DIAGNOSIS — Z87891 Personal history of nicotine dependence: Secondary | ICD-10-CM | POA: Insufficient documentation

## 2013-08-16 MED ORDER — HYDROCODONE-ACETAMINOPHEN 5-325 MG PO TABS
1.0000 | ORAL_TABLET | Freq: Once | ORAL | Status: AC
  Administered 2013-08-16: 1 via ORAL
  Filled 2013-08-16: qty 1

## 2013-08-16 NOTE — ED Notes (Signed)
Pt. missed her step and twisted her right ankle at work today , presents with pain / swelling at right ankle .

## 2013-08-17 NOTE — ED Provider Notes (Signed)
CSN: [id number]     Arrival date & time 08/16/13  2029 History   First MD Initiated Contact with Patient 08/16/13 2144     Chief Complaint  Patient presents with  . Ankle Pain   HPI   History provided by the patient. The patient is a 32 year old female with no significant PMH who presents with complaints of right ankle pain and injury. Patient states she was walking down steps when she slipped causing her to stumble and right ankle and leg. Accident occurred earlier in the day at work. She had some immediate pain and reports hearing a pop in her legs. Pain was sudden mild initially and patient was able to continue her work shift. After returning home she removed her shoe and had quick swelling of her ankle area with worsened pain. Since then it has been more difficult to put any pressure or weight on the leg and ankle. She did not take any medications for her symptoms. Denies any weakness or numbness. There was no other injury from the accident.    Past Medical History  Diagnosis Date  . Anxiety   . PIH (pregnancy induced hypertension)     hx 3 yrs ag0 - no problems since delivery  . Headache(784.0)     otc meds prn - last one 2 wks ago  . Abnormal Pap smear    Past Surgical History  Procedure Laterality Date  . Cholecystectomy  2004  . Wisdom tooth extraction    . Svd      x 2  . Colonoscopy    . Upper gastrointestinal endoscopy    . Cervical conization w/bx  12/13/2011    Procedure: CONIZATION CERVIX WITH BIOPSY;  Surgeon: Hal Morales, MD;  Location: [facility];  Service: Gynecology;  Laterality: N/A;  Cold Knife   No family history on file. History  Substance Use Topics  . Smoking status: Former Smoker -- 0.50 packs/day for 3 years    Types: Cigarettes  . Smokeless tobacco: Never Used     Comment: quit 2005  . Alcohol Use: Yes     Comment: social   OB History   Grav Para Term Preterm Abortions TAB SAB Ect Mult Living   2 2 2       2      Review of Systems   Neurological: Negative for weakness and numbness.  All other systems reviewed and are negative.    Allergies  Sulfa antibiotics and Amoxicillin  Home Medications   Current Outpatient Rx  Name  Route  Sig  Dispense  Refill  . acetaminophen (TYLENOL) 500 MG tablet   Oral   Take 500 mg by mouth every 6 (six) hours as needed.         Marland Kitchen ibuprofen (ADVIL,MOTRIN) 200 MG tablet   Oral   Take 400 mg by mouth every 6 (six) hours as needed for pain.         . norgestimate-ethinyl estradiol (SPRINTEC 28) 0.25-35 MG-MCG tablet   Oral   Take 1 tablet by mouth daily.   1 Package   0    BP 173/99  Pulse 78  Temp(Src) 98 F (36.7 C) (Oral)  Resp 20  Ht 5\' 3"  (1.6 m)  Wt 158 lb (71.668 kg)  BMI 28.00 kg/m2  SpO2 99%  LMP 07/11/2013 Physical Exam  Nursing note and vitals reviewed. Constitutional: She is oriented to person, place, and time. She appears well-developed and well-nourished. No distress.  HENT:  Head: Normocephalic  and atraumatic.  Cardiovascular: Normal rate and regular rhythm.   Pulmonary/Chest: Effort normal and breath sounds normal. No respiratory distress. She has no wheezes. She has no rales.  Abdominal: Soft.  Musculoskeletal:  Reduced range of motion of the right ankle secondary to pain. There is mild to moderate swelling over the lateral and anterior portion of the ankle. No deformity of the lateral or medial malleolus. No proximal fifth metatarsal pain. No proximal fibula pain. Normal dorsal pedal pulses and sensations in the toes.  Neurological: She is alert and oriented to person, place, and time.  Skin: Skin is warm and dry. No rash noted.  Psychiatric: She has a normal mood and affect. Her behavior is normal.    ED Course  Procedures   COORDINATION OF CARE:  Nursing notes reviewed. Vital signs reviewed. Initial pt interview and examination performed.   Patient seen and she appears in some discomfort no acute distress. X-rays reviewed with no  signs of fracture or other concerning injury. Symptoms consistent with ankle sprain. Will place an ASO and provide crutches for comfort. Patient instructed to use rice therapy.   Treatment plan initiated: Medications  HYDROcodone-acetaminophen (NORCO/VICODIN) 5-325 MG per tablet 1 tablet (1 tablet Oral Given 08/16/13 2201)    Imaging Review Dg Ankle Complete Right  08/16/2013   CLINICAL DATA:  Twisting injury, pain and swelling  EXAM: RIGHT ANKLE - COMPLETE 3+ VIEW  COMPARISON:  06/01/2013  FINDINGS: There is no evidence of fracture, dislocation, or joint effusion. There is no evidence of arthropathy or other focal bone abnormality. Mild soft tissue swelling.  IMPRESSION: No acute osseous finding.   Electronically Signed   By: Ruel Favors M.D.   On: 08/16/2013 21:32     MDM   1. Ankle sprain, right, initial encounter        Angus Seller, PA-C 08/17/13 [phone]

## 2013-08-17 NOTE — ED Provider Notes (Signed)
Medical screening examination/treatment/procedure(s) were performed by non-physician practitioner and as supervising physician I was immediately available for consultation/collaboration.    Jodette Wik M Alyjah Lovingood, MD 08/17/13 0711 

## 2014-03-22 ENCOUNTER — Emergency Department (HOSPITAL_COMMUNITY)
Admission: EM | Admit: 2014-03-22 | Discharge: 2014-03-22 | Disposition: A | Discharge status: Home / Self Care | Payer: Medicaid Other | Attending: Emergency Medicine | Admitting: Emergency Medicine

## 2014-03-22 ENCOUNTER — Encounter (HOSPITAL_COMMUNITY): Payer: Self-pay | Admitting: Emergency Medicine

## 2014-03-22 DIAGNOSIS — Z880 Allergy status to penicillin: Secondary | ICD-10-CM | POA: Insufficient documentation

## 2014-03-22 DIAGNOSIS — Z79899 Other long term (current) drug therapy: Secondary | ICD-10-CM | POA: Insufficient documentation

## 2014-03-22 DIAGNOSIS — Z8659 Personal history of other mental and behavioral disorders: Secondary | ICD-10-CM | POA: Insufficient documentation

## 2014-03-22 DIAGNOSIS — Z3202 Encounter for pregnancy test, result negative: Secondary | ICD-10-CM | POA: Insufficient documentation

## 2014-03-22 DIAGNOSIS — S80862A Insect bite (nonvenomous), left lower leg, initial encounter: Secondary | ICD-10-CM

## 2014-03-22 DIAGNOSIS — Y9389 Activity, other specified: Secondary | ICD-10-CM | POA: Insufficient documentation

## 2014-03-22 DIAGNOSIS — Z9889 Other specified postprocedural states: Secondary | ICD-10-CM | POA: Insufficient documentation

## 2014-03-22 DIAGNOSIS — Y9289 Other specified places as the place of occurrence of the external cause: Secondary | ICD-10-CM | POA: Insufficient documentation

## 2014-03-22 DIAGNOSIS — S90569A Insect bite (nonvenomous), unspecified ankle, initial encounter: Secondary | ICD-10-CM | POA: Insufficient documentation

## 2014-03-22 DIAGNOSIS — Z87891 Personal history of nicotine dependence: Secondary | ICD-10-CM | POA: Insufficient documentation

## 2014-03-22 DIAGNOSIS — W57XXXA Bitten or stung by nonvenomous insect and other nonvenomous arthropods, initial encounter: Principal | ICD-10-CM

## 2014-03-22 LAB — POC URINE PREG, ED: Preg Test, Ur: NEGATIVE

## 2014-03-22 MED ORDER — DOXYCYCLINE HYCLATE 100 MG PO TABS: 100.0000 mg | ORAL_TABLET | Freq: Two times a day (BID) | ORAL | Status: DC

## 2014-03-22 MED ORDER — IBUPROFEN 800 MG PO TABS: 800.0000 mg | ORAL_TABLET | Freq: Three times a day (TID) | ORAL | Status: DC

## 2014-03-22 NOTE — Discharge Instructions (Signed)
Tick Bite Information Ticks are insects that attach themselves to the skin and draw blood for food. There are various types of ticks. Common types include wood ticks and deer ticks. Most ticks live in shrubs and grassy areas. Ticks can climb onto your body when you make contact with leaves or grass where the tick is waiting. The most common places on the body for ticks to attach themselves are the scalp, neck, armpits, waist, and groin. Most tick bites are harmless, but sometimes ticks carry germs that cause diseases. These germs can be spread to a person during the tick's feeding process. The chance of a disease spreading through a tick bite depends on:   The type of tick.  Time of year.   How long the tick is attached.   Geographic location.  HOW CAN YOU PREVENT TICK BITES? Take these steps to help prevent tick bites when you are outdoors:  Wear protective clothing. Long sleeves and long pants are best.   Wear white clothes so you can see ticks more easily.  Tuck your pant legs into your socks.   If walking on a trail, stay in the middle of the trail to avoid brushing against bushes.  Avoid walking through areas with long grass.  Put insect repellent on all exposed skin and along boot tops, pant legs, and sleeve cuffs.   Check clothing, hair, and skin repeatedly and before going inside.   Brush off any ticks that are not attached.  Take a shower or bath as soon as possible after being outdoors.  WHAT IS THE PROPER WAY TO REMOVE A TICK? Ticks should be removed as soon as possible to help prevent diseases caused by tick bites. 1. If latex gloves are available, put them on before trying to remove a tick.  2. Using fine-point tweezers, grasp the tick as close to the skin as possible. You may also use curved forceps or a tick removal tool. Grasp the tick as close to its head as possible. Avoid grasping the tick on its body. 3. Pull gently with steady upward pressure until  the tick lets go. Do not twist the tick or jerk it suddenly. This may break off the tick's head or mouth parts. 4. Do not squeeze or crush the tick's body. This could force disease-carrying fluids from the tick into your body.  5. After the tick is removed, wash the bite area and your hands with soap and water or other disinfectant such as alcohol. 6. Apply a small amount of antiseptic cream or ointment to the bite site.  7. Wash and disinfect any instruments that were used.  Do not try to remove a tick by applying a hot match, petroleum jelly, or fingernail polish to the tick. These methods do not work and may increase the chances of disease being spread from the tick bite.  WHEN SHOULD YOU SEEK MEDICAL CARE? Contact your health care provider if you are unable to remove a tick from your skin or if a part of the tick breaks off and is stuck in the skin.  After a tick bite, you need to be aware of signs and symptoms that could be related to diseases spread by ticks. Contact your health care provider if you develop any of the following in the days or weeks after the tick bite:  Unexplained fever.  Rash. A circular rash that appears days or weeks after the tick bite may indicate the possibility of Lyme disease. The rash may resemble   a target with a bull's-eye and may occur at a different part of your body than the tick bite.  Redness and swelling in the area of the tick bite.   Tender, swollen lymph glands.   Diarrhea.   Weight loss.   Cough.   Fatigue.   Muscle, joint, or bone pain.   Abdominal pain.   Headache.   Lethargy or a change in your level of consciousness.  Difficulty walking or moving your legs.   Numbness in the legs.   Paralysis.  Shortness of breath.   Confusion.   Repeated vomiting.  Document Released: 08/23/2000 Document Revised: 06/16/2013 Document Reviewed: 02/03/2013 [employer] Patient Information 2015 [employer]. This information is  not intended to replace advice given to you by your health care provider. Make sure you discuss any questions you have with your health care provider.  

## 2014-03-22 NOTE — ED Notes (Addendum)
Pt c/o tick bite on L medial knee x 3 days ago.  Pain score 2/10.  Pt reports she was working outside when she saw the tick.  Sts that she thought she removed it, but now the area is red and swollen.

## 2014-03-22 NOTE — Progress Notes (Signed)
[facility] provided pt with a list of primary care resources to help patient establish primary care.  °

## 2014-03-22 NOTE — ED Provider Notes (Signed)
CSN: [id number]     Arrival date & time 03/22/14  1023 History   First MD Initiated Contact with Patient 03/22/14 1130     Chief Complaint  Patient presents with  . Insect Bite     (Consider location/radiation/quality/duration/timing/severity/associated sxs/prior Treatment) HPI  33 year old female with history of anxiety presents for evaluation of possible tick bite.  Pt noticed a tick 3 days ago after travelling through the woods.  Sts she saw a tick on the medial side of left knee.  Pt asked a pharmacist for advice and was told to apply clear nail polish and subsequently remove the tick.  Approximately 1 day of tick exposure.  Pt unsure if she removed the entire tick.  Afterward she noticed a rash at the site, which has gradually progressed.  Rash is painful, not itching, but warm to the touch.  Denies joint pain but endorse chills and sweat yesterday, but no fever.  No n/v, weakness or tingling.  Denies CP, sob or other systemic problem. Denies headache  Pt did apply alcohol and peroxide to affected site without any improvement. Pt is on birth control.  Pt is allergic to PCN and Sulfa.      Past Medical History  Diagnosis Date  . Anxiety   . PIH (pregnancy induced hypertension)     hx 3 yrs ag0 - no problems since delivery  . Headache(784.0)     otc meds prn - last one 2 wks ago  . Abnormal Pap smear    Past Surgical History  Procedure Laterality Date  . Cholecystectomy  2004  . Wisdom tooth extraction    . Svd      x 2  . Colonoscopy    . Upper gastrointestinal endoscopy    . Cervical conization w/bx  12/13/2011    Procedure: CONIZATION CERVIX WITH BIOPSY;  Surgeon: Hal Morales, MD;  Location: [facility];  Service: Gynecology;  Laterality: N/A;  Cold Knife   History reviewed. No pertinent family history. History  Substance Use Topics  . Smoking status: Former Smoker -- 0.50 packs/day for 3 years    Types: Cigarettes  . Smokeless tobacco: Never Used     Comment: quit 2005   . Alcohol Use: Yes     Comment: social   OB History   Grav Para Term Preterm Abortions TAB SAB Ect Mult Living   2 2 2       2      Review of Systems  All other systems reviewed and are negative.     Allergies  Sulfa antibiotics and Amoxicillin  Home Medications   Prior to Admission medications   Medication Sig Start Date End Date Taking? Authorizing Provider  acetaminophen (TYLENOL) 500 MG tablet Take 1,000 mg by mouth every 6 (six) hours as needed for moderate pain.    Yes Historical Provider, MD  norgestimate-ethinyl estradiol (SPRINTEC 28) 0.25-35 MG-MCG tablet Take 1 tablet by mouth daily. 08/05/12  Yes Purcell Nails, MD   BP 148/87  Pulse 96  Temp(Src) 98.3 F (36.8 C) (Oral)  Resp 16  SpO2 98% Physical Exam  Nursing note and vitals reviewed. Constitutional: She appears well-developed and well-nourished. No distress.  HENT:  Head: Atraumatic.  Eyes: Conjunctivae are normal.  Neck: Neck supple.  Neurological: She is alert.  Skin: Rash (L medial knee region:  a 1cm raised skin irritation with small scab and discrete margin, mildly pearlescence, mildly tender, no central clearing. no embeded tick head) noted.  Psychiatric: She  has a normal mood and affect.    ED Course  Procedures (including critical care time)  12:25 PM Pt with a small localized skin irritation from a recent tick bite.  Rash appearance is unconcerning.  Pt without systemic complaint.  Low suspicion for Lyme or RMSF.  Will prescribe doxy (preg test neg).  Return precaution discussed.    Labs Review Labs Reviewed  POC URINE PREG, ED    Imaging Review No results found.   EKG Interpretation None      MDM   Final diagnoses:  Tick bite of left lower leg, initial encounter    BP 148/87  Pulse 96  Temp(Src) 98.3 F (36.8 C) (Oral)  Resp 16  SpO2 98%     Fayrene HelperBowie Chadwick Reiswig, PA-C 03/22/14 1230

## 2014-03-24 NOTE — ED Provider Notes (Signed)
Medical screening examination/treatment/procedure(s) were performed by non-physician practitioner and as supervising physician I was immediately available for consultation/collaboration.   Dorinne Graeff T Debria Broecker, MD 03/24/14 2244 

## 2014-07-11 ENCOUNTER — Encounter (HOSPITAL_COMMUNITY): Payer: Self-pay | Admitting: Emergency Medicine

## 2015-04-14 ENCOUNTER — Emergency Department (HOSPITAL_COMMUNITY)
Admission: EM | Admit: 2015-04-14 | Discharge: 2015-04-15 | Disposition: A | Discharge status: Home / Self Care | Payer: Medicaid Other | Attending: Emergency Medicine | Admitting: Emergency Medicine

## 2015-04-14 ENCOUNTER — Encounter (HOSPITAL_COMMUNITY): Payer: Self-pay | Admitting: Emergency Medicine

## 2015-04-14 DIAGNOSIS — Z791 Long term (current) use of non-steroidal anti-inflammatories (NSAID): Secondary | ICD-10-CM | POA: Insufficient documentation

## 2015-04-14 DIAGNOSIS — Z87891 Personal history of nicotine dependence: Secondary | ICD-10-CM | POA: Insufficient documentation

## 2015-04-14 DIAGNOSIS — Z8659 Personal history of other mental and behavioral disorders: Secondary | ICD-10-CM | POA: Insufficient documentation

## 2015-04-14 DIAGNOSIS — Z7902 Long term (current) use of antithrombotics/antiplatelets: Secondary | ICD-10-CM | POA: Insufficient documentation

## 2015-04-14 DIAGNOSIS — Y9289 Other specified places as the place of occurrence of the external cause: Secondary | ICD-10-CM | POA: Insufficient documentation

## 2015-04-14 DIAGNOSIS — Y998 Other external cause status: Secondary | ICD-10-CM | POA: Insufficient documentation

## 2015-04-14 DIAGNOSIS — W57XXXA Bitten or stung by nonvenomous insect and other nonvenomous arthropods, initial encounter: Secondary | ICD-10-CM | POA: Insufficient documentation

## 2015-04-14 DIAGNOSIS — Z79818 Long term (current) use of other agents affecting estrogen receptors and estrogen levels: Secondary | ICD-10-CM | POA: Insufficient documentation

## 2015-04-14 DIAGNOSIS — Z880 Allergy status to penicillin: Secondary | ICD-10-CM | POA: Insufficient documentation

## 2015-04-14 DIAGNOSIS — Y9389 Activity, other specified: Secondary | ICD-10-CM | POA: Insufficient documentation

## 2015-04-14 DIAGNOSIS — L03311 Cellulitis of abdominal wall: Secondary | ICD-10-CM | POA: Insufficient documentation

## 2015-04-14 MED ORDER — OXYCODONE-ACETAMINOPHEN 5-325 MG PO TABS
2.0000 | ORAL_TABLET | Freq: Once | ORAL | Status: AC
Start: 2015-04-14 — End: 2015-04-15
  Administered 2015-04-15: 2 via ORAL
  Filled 2015-04-14: qty 2

## 2015-04-14 MED ORDER — CLINDAMYCIN HCL 300 MG PO CAPS
300.0000 mg | ORAL_CAPSULE | Freq: Once | ORAL | Status: AC
  Administered 2015-04-15: 300 mg via ORAL
  Filled 2015-04-14: qty 1

## 2015-04-14 MED ORDER — TRAMADOL HCL 50 MG PO TABS: 50.0000 mg | ORAL_TABLET | Freq: Four times a day (QID) | ORAL | Status: DC | PRN

## 2015-04-14 MED ORDER — CLINDAMYCIN HCL 300 MG PO CAPS: 300.0000 mg | ORAL_CAPSULE | Freq: Three times a day (TID) | ORAL | Status: DC

## 2015-04-14 MED ORDER — IBUPROFEN 200 MG PO TABS
600.0000 mg | ORAL_TABLET | Freq: Once | ORAL | Status: AC
  Administered 2015-04-15: 600 mg via ORAL
  Filled 2015-04-14: qty 3

## 2015-04-14 NOTE — ED Notes (Signed)
Pt reports possibly being bitten by spider to right lower back. Redness and warmth present.

## 2015-04-14 NOTE — Discharge Instructions (Signed)

## 2015-04-14 NOTE — ED Notes (Signed)
Pt from home with a reddened area that appears to be an insect bite on her left lower back. It spreads across approximately 10 inches. It is hot to the touch and she reports her pain around a 7/10. There is one open puncture.

## 2015-04-14 NOTE — ED Provider Notes (Signed)
CSN: [id number]     Arrival date & time 04/14/15  2114 History   This chart was scribed for Raeford Razor, MD by Arlan Organ, ED Scribe. This patient was seen in room WA03/WA03 and the patient's care was started 11:21 PM.   Chief Complaint  Patient presents with  . Insect Bite   The history is provided by the patient. No language interpreter was used.    HPI Comments: [name] is a 34 y.o. female without any pertinent past medical history who presents to the Emergency Department here after noticing a possible insect bite to the R flank x 1 day. Area has progressively increased in size with noted erythema, warmth, and tenderness. Pain to area is currently rated 7/10. She denies any drainage from area. No OTC topical or oral medications attempted prior to arrival. No recent fever or chills. [name] denies any history of diabetes. Pt with known allergies to Sulfa Antibiotics and Amoxicillin.  Past Medical History  Diagnosis Date  . Anxiety   . PIH (pregnancy induced hypertension)     hx 3 yrs ag0 - no problems since delivery  . Headache(784.0)     otc meds prn - last one 2 wks ago  . Abnormal Pap smear    Past Surgical History  Procedure Laterality Date  . Cholecystectomy  2004  . Wisdom tooth extraction    . Svd      x 2  . Colonoscopy    . Upper gastrointestinal endoscopy    . Cervical conization w/bx  12/13/2011    Procedure: CONIZATION CERVIX WITH BIOPSY;  Surgeon: Hal Morales, MD;  Location: [facility];  Service: Gynecology;  Laterality: N/A;  Cold Knife   No family history on file. History  Substance Use Topics  . Smoking status: Former Smoker -- 0.50 packs/day for 3 years    Types: Cigarettes  . Smokeless tobacco: Never Used     Comment: quit 2005  . Alcohol Use: Yes     Comment: social   OB History    Gravida Para Term Preterm AB TAB SAB Ectopic Multiple Living   Review of Systems  Constitutional: Negative for fever and chills.   Respiratory: Negative for shortness of breath.   Cardiovascular: Negative for chest pain.  Gastrointestinal: Negative for nausea, vomiting and abdominal pain.  Skin: Positive for color change and wound.  Neurological: Negative for headaches.  Psychiatric/Behavioral: Negative for confusion.  All other systems reviewed and are negative.     Allergies  Sulfa antibiotics and Amoxicillin  Home Medications   Prior to Admission medications   Medication Sig Start Date End Date Taking? Authorizing Provider  acetaminophen (TYLENOL) 500 MG tablet Take 1,000 mg by mouth every 6 (six) hours as needed for moderate pain.     Historical Provider, MD  doxycycline (VIBRA-TABS) 100 MG tablet Take 1 tablet (100 mg total) by mouth 2 (two) times daily. 03/22/14   Fayrene Helper, PA-C  ibuprofen (ADVIL,MOTRIN) 800 MG tablet Take 1 tablet (800 mg total) by mouth 3 (three) times daily. 03/22/14   Fayrene Helper, PA-C  norgestimate-ethinyl estradiol (SPRINTEC 28) 0.25-35 MG-MCG tablet Take 1 tablet by mouth daily. 08/05/12   Osborn Coho, MD   Triage Vitals: BP 174/101 mmHg  Pulse 112  Temp(Src) 98.4 F (36.9 C) (Oral)  Resp 20  SpO2 99%  LMP 04/06/2015   Physical Exam  Constitutional: She is oriented  to person, place, and time. She appears well-developed and well-nourished.  HENT:  Head: Normocephalic.  Eyes: EOM are normal.  Neck: Normal range of motion.  Pulmonary/Chest: Effort normal.  Abdominal: She exhibits no distension.  Musculoskeletal: Normal range of motion.  Neurological: She is alert and oriented to person, place, and time.  Skin:  Small pustule to R flank with about 2 cm of surround induration and 12-14 cm of erythema beyond that No drainage  Psychiatric: She has a normal mood and affect.  Nursing note and vitals reviewed.   ED Course  Procedures (including critical care time)  DIAGNOSTIC STUDIES: Oxygen Saturation is 99% on RA, Normal by my interpretation.    COORDINATION OF  CARE: 11:23 PM- Will give Percocet, Motrin, and Cleocin. Discussed treatment plan with pt at bedside and pt agreed to plan.     Labs Review Labs Reviewed - No data to display  Imaging Review No results found.   EKG Interpretation None      MDM   Final diagnoses:  Cellulitis of abdominal wall    34 year old female with cellulitis of her right flank. Bedside ultrasound does not show significant drainable collection. She is afebrile. Not toxic. Plan antibiotics at this point. Return precautions were discussed.  I personally preformed the services scribed in my presence. The recorded information has been reviewed is accurate. Raeford Razor, MD.    Raeford Razor, MD 04/23/15 [phone]

## 2015-09-10 HISTORY — PX: TUBAL LIGATION: SHX77

## 2015-11-21 DIAGNOSIS — Z8751 Personal history of pre-term labor: Secondary | ICD-10-CM

## 2015-11-21 DIAGNOSIS — Z3490 Encounter for supervision of normal pregnancy, unspecified, unspecified trimester: Secondary | ICD-10-CM | POA: Insufficient documentation

## 2015-11-21 LAB — OB RESULTS CONSOLE HEPATITIS B SURFACE ANTIGEN
Hepatitis B Surface Ag: NEGATIVE
Hepatitis B Surface Ag: NEGATIVE

## 2015-11-21 LAB — OB RESULTS CONSOLE ABO/RH
RH TYPE: POSITIVE
RH Type: POSITIVE

## 2015-11-21 LAB — OB RESULTS CONSOLE RPR
RPR: NONREACTIVE
RPR: NONREACTIVE

## 2015-11-21 LAB — OB RESULTS CONSOLE ANTIBODY SCREEN
ANTIBODY SCREEN: NEGATIVE
Antibody Screen: NEGATIVE

## 2015-11-21 LAB — OB RESULTS CONSOLE GC/CHLAMYDIA
CHLAMYDIA, DNA PROBE: NEGATIVE
Chlamydia: NEGATIVE
Gonorrhea: NEGATIVE
Gonorrhea: NEGATIVE

## 2015-11-21 LAB — OB RESULTS CONSOLE RUBELLA ANTIBODY, IGM
RUBELLA: IMMUNE
Rubella: IMMUNE

## 2015-11-21 LAB — OB RESULTS CONSOLE HIV ANTIBODY (ROUTINE TESTING)
HIV: NONREACTIVE
HIV: NONREACTIVE

## 2015-12-28 ENCOUNTER — Encounter (HOSPITAL_COMMUNITY): Payer: Self-pay | Admitting: *Deleted

## 2015-12-28 ENCOUNTER — Inpatient Hospital Stay (HOSPITAL_COMMUNITY)
Admission: AD | Admit: 2015-12-28 | Discharge: 2015-12-28 | Disposition: A | Discharge status: Home / Self Care | Payer: Medicaid Other | Source: Ambulatory Visit | Attending: Obstetrics and Gynecology | Admitting: Obstetrics and Gynecology

## 2015-12-28 DIAGNOSIS — O3440 Maternal care for other abnormalities of cervix, unspecified trimester: Secondary | ICD-10-CM

## 2015-12-28 DIAGNOSIS — Z880 Allergy status to penicillin: Secondary | ICD-10-CM | POA: Diagnosis not present

## 2015-12-28 DIAGNOSIS — F419 Anxiety disorder, unspecified: Secondary | ICD-10-CM | POA: Insufficient documentation

## 2015-12-28 DIAGNOSIS — Z882 Allergy status to sulfonamides status: Secondary | ICD-10-CM | POA: Diagnosis not present

## 2015-12-28 DIAGNOSIS — O99341 Other mental disorders complicating pregnancy, first trimester: Secondary | ICD-10-CM | POA: Diagnosis not present

## 2015-12-28 DIAGNOSIS — Z8632 Personal history of gestational diabetes: Secondary | ICD-10-CM

## 2015-12-28 DIAGNOSIS — Z87891 Personal history of nicotine dependence: Secondary | ICD-10-CM | POA: Diagnosis not present

## 2015-12-28 DIAGNOSIS — E669 Obesity, unspecified: Secondary | ICD-10-CM

## 2015-12-28 DIAGNOSIS — Z3A12 12 weeks gestation of pregnancy: Secondary | ICD-10-CM | POA: Diagnosis not present

## 2015-12-28 DIAGNOSIS — Z8751 Personal history of pre-term labor: Secondary | ICD-10-CM

## 2015-12-28 DIAGNOSIS — O09299 Supervision of pregnancy with other poor reproductive or obstetric history, unspecified trimester: Secondary | ICD-10-CM

## 2015-12-28 DIAGNOSIS — O09899 Supervision of other high risk pregnancies, unspecified trimester: Secondary | ICD-10-CM

## 2015-12-28 DIAGNOSIS — O09219 Supervision of pregnancy with history of pre-term labor, unspecified trimester: Secondary | ICD-10-CM

## 2015-12-28 LAB — URINALYSIS, ROUTINE W REFLEX MICROSCOPIC
BILIRUBIN URINE: NEGATIVE
Glucose, UA: NEGATIVE mg/dL
KETONES UR: 15 mg/dL — AB
LEUKOCYTES UA: NEGATIVE
NITRITE: NEGATIVE
Protein, ur: NEGATIVE mg/dL
SPECIFIC GRAVITY, URINE: 1.015 (ref 1.005–1.030)
pH: 5.5 (ref 5.0–8.0)

## 2015-12-28 LAB — URINE MICROSCOPIC-ADD ON

## 2015-12-28 LAB — DIFFERENTIAL
Basophils Absolute: 0 10*3/uL (ref 0.0–0.1)
Basophils Relative: 0 %
EOS ABS: 0.3 10*3/uL (ref 0.0–0.7)
Eosinophils Relative: 2 %
LYMPHS ABS: 4.4 10*3/uL — AB (ref 0.7–4.0)
LYMPHS PCT: 31 %
MONO ABS: 0.8 10*3/uL (ref 0.1–1.0)
Monocytes Relative: 5 %
Neutro Abs: 8.6 10*3/uL — ABNORMAL HIGH (ref 1.7–7.7)
Neutrophils Relative %: 62 %

## 2015-12-28 LAB — COMPREHENSIVE METABOLIC PANEL
ALK PHOS: 54 U/L (ref 38–126)
ALT: 13 U/L — ABNORMAL LOW (ref 14–54)
ANION GAP: 7 (ref 5–15)
AST: 16 U/L (ref 15–41)
Albumin: 3.3 g/dL — ABNORMAL LOW (ref 3.5–5.0)
BILIRUBIN TOTAL: 0.7 mg/dL (ref 0.3–1.2)
BUN: 5 mg/dL — AB (ref 6–20)
CALCIUM: 8.6 mg/dL — AB (ref 8.9–10.3)
CO2: 23 mmol/L (ref 22–32)
Chloride: 105 mmol/L (ref 101–111)
Creatinine, Ser: 0.48 mg/dL (ref 0.44–1.00)
GFR calc Af Amer: >60 mL/min (ref >60)
GFR calc non Af Amer: >60 mL/min (ref >60)
GLUCOSE: 88 mg/dL (ref 65–99)
Potassium: 3 mmol/L — ABNORMAL LOW (ref 3.5–5.1)
Sodium: 135 mmol/L (ref 135–145)
TOTAL PROTEIN: 6.6 g/dL (ref 6.5–8.1)

## 2015-12-28 LAB — CBC
HEMATOCRIT: 38.1 % (ref 36.0–46.0)
HEMOGLOBIN: 13.2 g/dL (ref 12.0–15.0)
MCH: 31.7 pg (ref 26.0–34.0)
MCHC: 34.6 g/dL (ref 30.0–36.0)
MCV: 91.4 fL (ref 78.0–100.0)
Platelets: 211 10*3/uL (ref 150–400)
RBC: 4.17 MIL/uL (ref 3.87–5.11)
RDW: 12.3 % (ref 11.5–15.5)
WBC: 14.2 10*3/uL — AB (ref 4.0–10.5)

## 2015-12-28 LAB — PROTEIN / CREATININE RATIO, URINE
Creatinine, Urine: 100 mg/dL
PROTEIN CREATININE RATIO: 0.1 mg/mg{creat} (ref 0.00–0.15)
Total Protein, Urine: 10 mg/dL

## 2015-12-28 LAB — URIC ACID: Uric Acid, Serum: 3.2 mg/dL (ref 2.3–6.6)

## 2015-12-28 LAB — LACTATE DEHYDROGENASE: LDH: 116 U/L (ref 98–192)

## 2015-12-28 MED ORDER — BUSPIRONE HCL 7.5 MG PO TABS: 7.5000 mg | ORAL_TABLET | Freq: Two times a day (BID) | ORAL | Status: DC

## 2015-12-28 NOTE — MAU Note (Addendum)
Pt has hx of anxiety, but has become severe with this pregnancy.  HA x 4 days, unable to focus on anything today, feels like BP is high, chest feels tight,  has hx of PIH.  Has occasional lower abd pain, denies bleeding.  Not having pain now.

## 2015-12-28 NOTE — Discharge Instructions (Signed)
Generalized Anxiety Disorder Generalized anxiety disorder (GAD) is a mental disorder. It interferes with life functions, including relationships, work, and school. GAD is different from normal anxiety, which everyone experiences at some point in their lives in response to specific life events and activities. Normal anxiety actually helps us prepare for and get through these life events and activities. Normal anxiety goes away after the event or activity is over.  GAD causes anxiety that is not necessarily related to specific events or activities. It also causes excess anxiety in proportion to specific events or activities. The anxiety associated with GAD is also difficult to control. GAD can vary from mild to severe. People with severe GAD can have intense waves of anxiety with physical symptoms (panic attacks).  SYMPTOMS The anxiety and worry associated with GAD are difficult to control. This anxiety and worry are related to many life events and activities and also occur more days than not for 6 months or longer. People with GAD also have three or more of the following symptoms (one or more in children):  Restlessness.   Fatigue.  Difficulty concentrating.   Irritability.  Muscle tension.  Difficulty sleeping or unsatisfying sleep. DIAGNOSIS GAD is diagnosed through an assessment by your health care provider. Your health care provider will ask you questions aboutyour mood,physical symptoms, and events in your life. Your health care provider may ask you about your medical history and use of alcohol or drugs, including prescription medicines. Your health care provider may also do a physical exam and blood tests. Certain medical conditions and the use of certain substances can cause symptoms similar to those associated with GAD. Your health care provider may refer you to a mental health specialist for further evaluation. TREATMENT The following therapies are usually used to treat GAD:    Medication. Antidepressant medication usually is prescribed for long-term daily control. Antianxiety medicines may be added in severe cases, especially when panic attacks occur.   Talk therapy (psychotherapy). Certain types of talk therapy can be helpful in treating GAD by providing support, education, and guidance. A form of talk therapy called cognitive behavioral therapy can teach you healthy ways to think about and react to daily life events and activities.  Stress managementtechniques. These include yoga, meditation, and exercise and can be very helpful when they are practiced regularly. A mental health specialist can help determine which treatment is best for you. Some people see improvement with one therapy. However, other people require a combination of therapies.   This information is not intended to replace advice given to you by your health care provider. Make sure you discuss any questions you have with your health care provider.   Document Released: 12/21/2012 Document Revised: 09/16/2014 Document Reviewed: 12/21/2012 Elsevier Interactive Patient Education 2016 [employer] Inc[employer]  

## 2015-12-28 NOTE — MAU Provider Note (Signed)
History  [name] is a 35 yo G3P1102 @ 12.5 wks who presents unannounced to MAU w/ c/o being "overwhelmed w/ everything today", a feeling that "just came out of the blue." Also thinks her BPs are elevated due to headache over the past few days. Diagnosed w/ anxiety d/o when she was about 43 or 35 years old. Reveals that for the past 5-6 years she has been able to control her anxiety w/ breathing methods and classes, but both are no longer effective. Took med for anxiety off and on for about 6 months, but does not remember when or the name of the medication. Not currently in therapy. Denies vaginal bleeding, change in vaginal discharge, fever, malaise, chills, body aches or N/V.  Patient Active Problem List   Diagnosis Date Noted  . Allergy history, sulfonamide 12/28/2015  . Allergy to amoxicillin 12/28/2015  . History of premature delivery, currently pregnant 12/28/2015  . Obesity (BMI 30-39.9) 12/28/2015  . Pregnancy complicated by prior cervical conization, antepartum (with biopsy) 12/28/2015  . Hx of preeclampsia, prior pregnancy, currently pregnant 12/28/2015  . H/O gestational diabetes in prior pregnancy, currently pregnant 12/28/2015  . 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 Anxiety 12/13/2011    Chief Complaint  Patient presents with  . Anxiety  . Headache   HPI  As above  OB History    Gravida Para Term Preterm AB TAB SAB Ectopic Multiple Living   Past Medical History  Diagnosis Date  . Anxiety   . PIH (pregnancy induced hypertension)     hx 3 yrs ag0 - no problems since delivery  . Headache(784.0)     otc meds prn - last one 2 wks ago  . Abnormal Pap smear     Past Surgical History  Procedure Laterality Date  . Cholecystectomy  2004  . Wisdom tooth extraction    . Svd      x 2  . Colonoscopy    . Upper gastrointestinal endoscopy    . Cervical conization w/bx  12/13/2011    Procedure: CONIZATION CERVIX WITH  BIOPSY;  Surgeon: Hal Morales, MD;  Location: [facility];  Service: Gynecology;  Laterality: N/A;  Cold Knife    History reviewed. No pertinent family history.  Social History  Substance Use Topics  . Smoking status: Former Smoker -- 0.50 packs/day for 3 years    Types: Cigarettes  . Smokeless tobacco: Never Used     Comment: quit 2005  . Alcohol Use: No     Comment: social/ none during pregnacy    Allergies:  Allergies  Allergen Reactions  . Sulfa Antibiotics Rash    fever  . Amoxicillin Rash    High fever Has patient had a PCN reaction causing immediate rash, facial/tongue/throat swelling, SOB or lightheadedness with hypotension: no Has patient had a PCN reaction causing severe rash involving mucus membranes or skin necrosis: no Has patient had a PCN reaction that required hospitalization no Has patient had a PCN reaction occurring within the last 10 years: no If all of the above answers are "NO", then may proceed with Cephalosporin use.     No prescriptions prior to admission    ROS  Per HPI Physical Exam   Results for orders placed or performed during the hospital encounter of 12/28/15 (from the past 24 hour(s))  Protein / creatinine ratio, urine     Status:  None   Collection Time: 12/28/15  7:52 PM  Result Value Ref Range   Creatinine, Urine 100.00 mg/dL   Total Protein, Urine 10 mg/dL   Protein Creatinine Ratio 0.10 0.00 - 0.15 mg/mg[Cre]  Urinalysis, Routine w reflex microscopic (not at [facility])     Status: Abnormal   Collection Time: 12/28/15  7:52 PM  Result Value Ref Range   Color, Urine YELLOW YELLOW   APPearance HAZY (A) CLEAR   Specific Gravity, Urine 1.015 1.005 - 1.030   pH 5.5 5.0 - 8.0   Glucose, UA NEGATIVE NEGATIVE mg/dL   Hgb urine dipstick SMALL (A) NEGATIVE   Bilirubin Urine NEGATIVE NEGATIVE   Ketones, ur 15 (A) NEGATIVE mg/dL   Protein, ur NEGATIVE NEGATIVE mg/dL   Nitrite NEGATIVE NEGATIVE   Leukocytes, UA NEGATIVE NEGATIVE  Urine  microscopic-add on     Status: Abnormal   Collection Time: 12/28/15  7:52 PM  Result Value Ref Range   Squamous Epithelial / LPF 6-30 (A) NONE SEEN   WBC, UA 0-5 0 - 5 WBC/hpf   RBC / HPF 0-5 0 - 5 RBC/hpf   Bacteria, UA MANY (A) NONE SEEN   Urine-Other MUCOUS PRESENT   CBC     Status: Abnormal   Collection Time: 12/28/15  8:23 PM  Result Value Ref Range   WBC 14.2 (H) 4.0 - 10.5 K/uL   RBC 4.17 3.87 - 5.11 MIL/uL   Hemoglobin 13.2 12.0 - 15.0 g/dL   HCT 16.1 09.6 - 04.5 %   MCV 91.4 78.0 - 100.0 fL   MCH 31.7 26.0 - 34.0 pg   MCHC 34.6 30.0 - 36.0 g/dL   RDW 40.9 81.1 - 91.4 %   Platelets 211 150 - 400 K/uL  Comprehensive metabolic panel     Status: Abnormal   Collection Time: 12/28/15  8:23 PM  Result Value Ref Range   Sodium 135 135 - 145 mmol/L   Potassium 3.0 (L) 3.5 - 5.1 mmol/L   Chloride 105 101 - 111 mmol/L   CO2 23 22 - 32 mmol/L   Glucose, Bld 88 65 - 99 mg/dL   BUN 5 (L) 6 - 20 mg/dL   Creatinine, Ser 7.82 0.44 - 1.00 mg/dL   Calcium 8.6 (L) 8.9 - 10.3 mg/dL   Total Protein 6.6 6.5 - 8.1 g/dL   Albumin 3.3 (L) 3.5 - 5.0 g/dL   AST 16 15 - 41 U/L   ALT 13 (L) 14 - 54 U/L   Alkaline Phosphatase 54 38 - 126 U/L   Total Bilirubin 0.7 0.3 - 1.2 mg/dL   GFR calc non Af Amer >60 >60 mL/min   GFR calc Af Amer >60 >60 mL/min   Anion gap 7 5 - 15  Lactate dehydrogenase     Status: None   Collection Time: 12/28/15  8:23 PM  Result Value Ref Range   LDH 116 98 - 192 U/L  Uric acid     Status: None   Collection Time: 12/28/15  8:23 PM  Result Value Ref Range   Uric Acid, Serum 3.2 2.3 - 6.6 mg/dL  Differential     Status: Abnormal   Collection Time: 12/28/15  8:23 PM  Result Value Ref Range   Neutrophils Relative % 62 %   Neutro Abs 8.6 (H) 1.7 - 7.7 K/uL   Lymphocytes Relative 31 %   Lymphs Abs 4.4 (H) 0.7 - 4.0 K/uL   Monocytes Relative 5 %   Monocytes Absolute 0.8  0.1 - 1.0 K/uL   Eosinophils Relative 2 %   Eosinophils Absolute 0.3 0.0 - 0.7 K/uL    Basophils Relative 0 %   Basophils Absolute 0.0 0.0 - 0.1 K/uL   Blood pressure 131/80, pulse 97, temperature 98.6 F (37 C), temperature source Oral, resp. rate 18, last menstrual period 04/06/2015, SpO2 97 %. Today's Vitals   12/28/15 1832 12/28/15 1919 12/28/15 2054  BP: 141/87 134/83 131/80  Pulse: 107 105 97  Temp: 97.9 F (36.6 C)  98.6 F (37 C)  TempSrc: Oral  Oral  Resp: 18 18 18   SpO2: 97%     Physical Exam  Gen: NAD. Neuro: Grossly intact w/o focal deficits. Lungs: CTAB. CV: RRR w/o M/R/G. Abdomen: gravid, soft, NT, no rebound or guarding. Doptones: 165 bpm   ED Course  Assessment: Anxiety H/O preE in 2nd pregnancy. Baseline labs obtained. Also obtained urine culture as UA may be contaminated. ? Chronic HTN  Plan: Trial of Buspar. Monitor BPs closely. Ok to take Tylenol, but encouraged her to also drink water and eat several times during the day. Office f/u for routine visit and to discuss Buspar's effectiveness or non-effectiveness. May benefit from therapy.    Sherre ScarletWILLIAMS, Neera Teng CNM, MS 12/28/2015 9:00 PM

## 2015-12-30 LAB — CULTURE, OB URINE

## 2016-02-28 ENCOUNTER — Encounter (HOSPITAL_COMMUNITY): Payer: Self-pay | Admitting: *Deleted

## 2016-02-28 ENCOUNTER — Inpatient Hospital Stay (HOSPITAL_COMMUNITY)
Admission: AD | Admit: 2016-02-28 | Discharge: 2016-02-28 | Disposition: A | Discharge status: Home / Self Care | Payer: Medicaid Other | Source: Ambulatory Visit | Attending: Obstetrics and Gynecology | Admitting: Obstetrics and Gynecology

## 2016-02-28 DIAGNOSIS — Z043 Encounter for examination and observation following other accident: Secondary | ICD-10-CM | POA: Insufficient documentation

## 2016-02-28 DIAGNOSIS — Z3A21 21 weeks gestation of pregnancy: Secondary | ICD-10-CM | POA: Insufficient documentation

## 2016-02-28 DIAGNOSIS — E876 Hypokalemia: Secondary | ICD-10-CM | POA: Insufficient documentation

## 2016-02-28 DIAGNOSIS — O99282 Endocrine, nutritional and metabolic diseases complicating pregnancy, second trimester: Secondary | ICD-10-CM | POA: Insufficient documentation

## 2016-02-28 DIAGNOSIS — W010XXA Fall on same level from slipping, tripping and stumbling without subsequent striking against object, initial encounter: Secondary | ICD-10-CM

## 2016-02-28 DIAGNOSIS — O99212 Obesity complicating pregnancy, second trimester: Secondary | ICD-10-CM | POA: Insufficient documentation

## 2016-02-28 DIAGNOSIS — W19XXXA Unspecified fall, initial encounter: Secondary | ICD-10-CM

## 2016-02-28 DIAGNOSIS — O99342 Other mental disorders complicating pregnancy, second trimester: Secondary | ICD-10-CM | POA: Diagnosis not present

## 2016-02-28 DIAGNOSIS — Z87891 Personal history of nicotine dependence: Secondary | ICD-10-CM | POA: Insufficient documentation

## 2016-02-28 DIAGNOSIS — F419 Anxiety disorder, unspecified: Secondary | ICD-10-CM | POA: Insufficient documentation

## 2016-02-28 DIAGNOSIS — O26892 Other specified pregnancy related conditions, second trimester: Secondary | ICD-10-CM | POA: Diagnosis not present

## 2016-02-28 LAB — CBC
HCT: 34.9 % — ABNORMAL LOW (ref 36.0–46.0)
HEMOGLOBIN: 11.9 g/dL — AB (ref 12.0–15.0)
MCH: 31.9 pg (ref 26.0–34.0)
MCHC: 34.1 g/dL (ref 30.0–36.0)
MCV: 93.6 fL (ref 78.0–100.0)
Platelets: 196 10*3/uL (ref 150–400)
RBC: 3.73 MIL/uL — AB (ref 3.87–5.11)
RDW: 12.8 % (ref 11.5–15.5)
WBC: 12.4 10*3/uL — ABNORMAL HIGH (ref 4.0–10.5)

## 2016-02-28 LAB — COMPREHENSIVE METABOLIC PANEL
ALK PHOS: 66 U/L (ref 38–126)
ALT: 11 U/L — ABNORMAL LOW (ref 14–54)
AST: 15 U/L (ref 15–41)
Albumin: 2.9 g/dL — ABNORMAL LOW (ref 3.5–5.0)
Anion gap: 8 (ref 5–15)
BILIRUBIN TOTAL: 0.5 mg/dL (ref 0.3–1.2)
BUN: 6 mg/dL (ref 6–20)
CALCIUM: 8.7 mg/dL — AB (ref 8.9–10.3)
CHLORIDE: 104 mmol/L (ref 101–111)
CO2: 22 mmol/L (ref 22–32)
CREATININE: 0.46 mg/dL (ref 0.44–1.00)
Glucose, Bld: 152 mg/dL — ABNORMAL HIGH (ref 65–99)
Potassium: 2.8 mmol/L — ABNORMAL LOW (ref 3.5–5.1)
Sodium: 134 mmol/L — ABNORMAL LOW (ref 135–145)
Total Protein: 6 g/dL — ABNORMAL LOW (ref 6.5–8.1)

## 2016-02-28 MED ORDER — POTASSIUM CHLORIDE ER 10 MEQ PO TBCR: 20.0000 meq | EXTENDED_RELEASE_TABLET | Freq: Two times a day (BID) | ORAL | Status: DC

## 2016-02-28 MED ORDER — POTASSIUM CHLORIDE CRYS ER 20 MEQ PO TBCR
40.0000 meq | EXTENDED_RELEASE_TABLET | ORAL | Status: AC
  Administered 2016-02-28: 40 meq via ORAL
  Filled 2016-02-28: qty 2

## 2016-02-28 MED ORDER — POTASSIUM CHLORIDE ER 10 MEQ PO TBCR: 10.0000 meq | EXTENDED_RELEASE_TABLET | Freq: Two times a day (BID) | ORAL | Status: DC

## 2016-02-28 NOTE — MAU Note (Signed)
Pt reports that she feel yesterday and has been sore since. Pt states that she feels "funny" today. She reports feeling lightheaded faint, but that she has been drinking plenty of water

## 2016-02-28 NOTE — MAU Provider Note (Signed)
Christina GlaserMelissa Cervantes is a 35yo, [id number] at  21.[redacted] wks ega presenting to MAU announced S/p a fall when walking her dog last night.   Pt states she slid/fell on her side and arm but did not hit her belly.  Denies contractions, cramping, lof, or vb. Reports feeling FM.  Pt has a Hx significant for anxiety, on zoloft 50 mg, and chronic Hypokalemia.    History     Patient Active Problem List   Diagnosis Date Noted  . Chronic hypokalemia 02/28/2016  . Allergy history, sulfonamide 12/28/2015  . Allergy to amoxicillin 12/28/2015  . History of premature delivery, currently pregnant 12/28/2015  . Obesity (BMI 30-39.9) 12/28/2015  . Pregnancy complicated by prior cervical conization, antepartum (with biopsy) 12/28/2015  . Hx of preeclampsia, prior pregnancy, currently pregnant 12/28/2015  . H/O gestational diabetes in prior pregnancy, currently pregnant 12/28/2015  . 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 Anxiety 12/13/2011    Chief Complaint  Patient presents with  . Fall   HPI  OB History    Gravida Para Term Preterm AB TAB SAB Ectopic Multiple Living   3 2 1 1      2       Past Medical History  Diagnosis Date  . Anxiety   . PIH (pregnancy induced hypertension)     hx 3 yrs ag0 - no problems since delivery  . Headache(784.0)     otc meds prn - last one 2 wks ago  . Abnormal Pap smear     Past Surgical History  Procedure Laterality Date  . Cholecystectomy  2004  . Wisdom tooth extraction    . Svd      x 2  . Colonoscopy    . Upper gastrointestinal endoscopy    . Cervical conization w/bx  12/13/2011    Procedure: CONIZATION CERVIX WITH BIOPSY;  Surgeon: Hal MoralesVanessa P Haygood, MD;  Location: [facility];  Service: Gynecology;  Laterality: N/A;  Cold Knife    No family history on file.  Social History  Substance Use Topics  . Smoking status: Former Smoker -- 0.50 packs/day for 3 years    Types: Cigarettes  . Smokeless tobacco: Never Used     Comment: quit 2005   . Alcohol Use: No     Comment: social/ none during pregnacy    Allergies:  Allergies  Allergen Reactions  . Sulfa Antibiotics Rash    fever  . Amoxicillin Rash    High fever Has patient had a PCN reaction causing immediate rash, facial/tongue/throat swelling, SOB or lightheadedness with hypotension: no Has patient had a PCN reaction causing severe rash involving mucus membranes or skin necrosis: no Has patient had a PCN reaction that required hospitalization no Has patient had a PCN reaction occurring within the last 10 years: no If all of the above answers are "NO", then may proceed with Cephalosporin use.     Prescriptions prior to admission  Medication Sig Dispense Refill Last Dose  . acetaminophen (TYLENOL) 500 MG tablet Take 1,000 mg by mouth every 6 (six) hours as needed for moderate pain.    02/28/2016 at 1130  . Prenatal Vit-Fe Fumarate-FA (PRENATAL MULTIVITAMIN) TABS tablet Take 1 tablet by mouth daily at 12 noon.   02/28/2016 at Unknown time  . sertraline (ZOLOFT) 50 MG tablet Take 50 mg by mouth daily.  0 02/28/2016 at Unknown time    ROS Physical Exam   Blood pressure 139/73, pulse 96, temperature 98.2 F (36.8 C), temperature  source Oral, resp. rate 18, height  (1.6 m), weight 94.53 kg (208 lb 6.4 oz), last menstrual period 04/06/2015, SpO2 100 %.    Results for orders placed or performed during the hospital encounter of 02/28/16 (from the past 24 hour(s))  CBC     Status: Abnormal   Collection Time: 02/28/16  8:33 PM  Result Value Ref Range   WBC 12.4 (H) 4.0 - 10.5 K/uL   RBC 3.73 (L) 3.87 - 5.11 MIL/uL   Hemoglobin 11.9 (L) 12.0 - 15.0 g/dL   HCT 45.4 (L) 09.8 - 11.9 %   MCV 93.6 78.0 - 100.0 fL   MCH 31.9 26.0 - 34.0 pg   MCHC 34.1 30.0 - 36.0 g/dL   RDW 14.7 82.9 - 56.2 %   Platelets 196 150 - 400 K/uL  Comprehensive metabolic panel     Status: Abnormal   Collection Time: 02/28/16  8:33 PM  Result Value Ref Range   Sodium 134 (L) 135 - 145  mmol/L   Potassium 2.8 (L) 3.5 - 5.1 mmol/L   Chloride 104 101 - 111 mmol/L   CO2 22 22 - 32 mmol/L   Glucose, Bld 152 (H) 65 - 99 mg/dL   BUN 6 6 - 20 mg/dL   Creatinine, Ser 1.30 0.44 - 1.00 mg/dL   Calcium 8.7 (L) 8.9 - 10.3 mg/dL   Total Protein 6.0 (L) 6.5 - 8.1 g/dL   Albumin 2.9 (L) 3.5 - 5.0 g/dL   AST 15 15 - 41 U/L   ALT 11 (L) 14 - 54 U/L   Alkaline Phosphatase 66 38 - 126 U/L   Total Bilirubin 0.5 0.3 - 1.2 mg/dL   GFR calc non Af Amer >60 >60 mL/min   GFR calc Af Amer >60 >60 mL/min   Anion gap 8 5 - 15    SVE: deferred  FHT 156  Physical Exam  Constitutional: She is oriented to person, place, and time. She appears well-developed.  HENT:  Head: Normocephalic.  Eyes: Pupils are equal, round, and reactive to light.  Neck: Normal range of motion.  Cardiovascular: Normal rate and regular rhythm.   Respiratory: Effort normal.  GI: Soft.  Musculoskeletal: Normal range of motion.  Neurological: She is alert and oriented to person, place, and time.  Skin: Skin is warm.  Healing Scratch/abrasion on left upper arm  Psychiatric: She has a normal mood and affect. Her behavior is normal.    ED Course  Assessment: IUP 21.4 wks  Stable S/p fall -No bleeding/No contraction FHT 156 Chronic Hypokalemia, 2.8   Plan: Consult Dr. Richardson Dopp 40 mEq Kdur, PO, Now Ibuprofen 600 mg for muscle aches, declined-states will take home DC home in stable condition Rx  BID for 3 days  Recommended to eat more bananas, oranges to increase potassium intake Advised not to take Ibuprofen past [redacted] wks gestation Keep appointment at [facility] 6/27; repeat CMP in office Call PRN   Alphonzo Severance CNM, MSN 02/28/2016 9:05 PM

## 2016-02-28 NOTE — Discharge Instructions (Signed)

## 2016-02-28 NOTE — MAU Note (Signed)
Pt states that she was walking dog last night and fell last night. Landed on left side. Felt fine last night but is now more sore. Having some pain on left side today. Denies vag bleeding or discharge. +FM.

## 2016-04-30 DIAGNOSIS — O139 Gestational [pregnancy-induced] hypertension without significant proteinuria, unspecified trimester: Secondary | ICD-10-CM | POA: Diagnosis present

## 2016-05-21 ENCOUNTER — Encounter (HOSPITAL_COMMUNITY): Payer: Self-pay

## 2016-05-21 ENCOUNTER — Other Ambulatory Visit: Payer: Self-pay | Admitting: Obstetrics and Gynecology

## 2016-05-21 ENCOUNTER — Emergency Department (HOSPITAL_COMMUNITY): Payer: Medicaid Other

## 2016-05-21 ENCOUNTER — Emergency Department (HOSPITAL_COMMUNITY)
Admission: EM | Admit: 2016-05-21 | Discharge: 2016-05-22 | Discharge status: Against Medical Advice | Payer: Medicaid Other | Source: Home / Self Care | Attending: Emergency Medicine | Admitting: Emergency Medicine

## 2016-05-21 DIAGNOSIS — Y999 Unspecified external cause status: Secondary | ICD-10-CM | POA: Insufficient documentation

## 2016-05-21 DIAGNOSIS — W010XXS Fall on same level from slipping, tripping and stumbling without subsequent striking against object, sequela: Secondary | ICD-10-CM | POA: Diagnosis not present

## 2016-05-21 DIAGNOSIS — O26893 Other specified pregnancy related conditions, third trimester: Secondary | ICD-10-CM | POA: Diagnosis not present

## 2016-05-21 DIAGNOSIS — W109XXA Fall (on) (from) unspecified stairs and steps, initial encounter: Secondary | ICD-10-CM | POA: Insufficient documentation

## 2016-05-21 DIAGNOSIS — Z87891 Personal history of nicotine dependence: Secondary | ICD-10-CM | POA: Insufficient documentation

## 2016-05-21 DIAGNOSIS — Y929 Unspecified place or not applicable: Secondary | ICD-10-CM

## 2016-05-21 DIAGNOSIS — Z3A33 33 weeks gestation of pregnancy: Secondary | ICD-10-CM | POA: Insufficient documentation

## 2016-05-21 DIAGNOSIS — Y939 Activity, unspecified: Secondary | ICD-10-CM | POA: Insufficient documentation

## 2016-05-21 DIAGNOSIS — S63287A Dislocation of proximal interphalangeal joint of left little finger, initial encounter: Secondary | ICD-10-CM | POA: Insufficient documentation

## 2016-05-21 DIAGNOSIS — O9A213 Injury, poisoning and certain other consequences of external causes complicating pregnancy, third trimester: Secondary | ICD-10-CM | POA: Insufficient documentation

## 2016-05-21 DIAGNOSIS — S63259A Unspecified dislocation of unspecified finger, initial encounter: Secondary | ICD-10-CM

## 2016-05-21 DIAGNOSIS — F419 Anxiety disorder, unspecified: Secondary | ICD-10-CM | POA: Diagnosis not present

## 2016-05-21 DIAGNOSIS — Z880 Allergy status to penicillin: Secondary | ICD-10-CM | POA: Diagnosis not present

## 2016-05-21 DIAGNOSIS — S62607S Fracture of unspecified phalanx of left little finger, sequela: Secondary | ICD-10-CM | POA: Diagnosis not present

## 2016-05-21 DIAGNOSIS — O99343 Other mental disorders complicating pregnancy, third trimester: Secondary | ICD-10-CM | POA: Diagnosis not present

## 2016-05-21 DIAGNOSIS — Z3490 Encounter for supervision of normal pregnancy, unspecified, unspecified trimester: Secondary | ICD-10-CM

## 2016-05-21 DIAGNOSIS — W19XXXA Unspecified fall, initial encounter: Secondary | ICD-10-CM

## 2016-05-21 MED ORDER — NIFEDIPINE 10 MG PO CAPS
20.0000 mg | ORAL_CAPSULE | Freq: Once | ORAL | Status: AC
  Administered 2016-05-21: 20 mg via ORAL
  Filled 2016-05-21: qty 2

## 2016-05-21 MED ORDER — LACTATED RINGERS IV BOLUS (SEPSIS): 1000.0000 mL | Freq: Once | INTRAVENOUS | Status: DC

## 2016-05-21 NOTE — ED Notes (Signed)
Pharmacy contacted for medication

## 2016-05-21 NOTE — ED Provider Notes (Addendum)
Pt se evaluated. D/W Dr. Cathlean CowerMikell.  Pt [redacted] wks pregnant, fell on to OSH.  C/O pain Lt 5th digit.  No VB or ROM.  BP that has been present.  Describes "Braxton-Hicks" contractions.  O[facility] RN reports reassuring strip.  RN wil discuss with Pts [facility]    Reduction of dislocation Date/Time: 10:08 PM Performed by: Claudean KindsJAMES, Shyloh Derosa JOSEPH Authorized by: Claudean KindsJAMES, Gilbert Narain JOSEPH Consent: Verbal consent obtained. Risks and benefits: risks, benefits and alternatives were discussed Consent given by: patient Required items: required blood products, implants, devices, and special equipment available Time out: Immediately prior to procedure a "time out" was called to verify the correct patient, procedure, equipment, support staff and site/side marked as required.  Patient sedated: NO  Vitals: Vital signs were monitored during sedation. Patient tolerance: Patient tolerated the procedure well with no immediate complications. Joint: Left 5th digit IP joint Reduction technique: Hyper extention,axial traction, flexion.  Post XR Pending.  11:00:  Post procedure x-ray shows poor plate fracture. She was splinted in the hand safe position. She declines [facility]. Her monitor here has been reassuring. Patient was advised that we'll be has recommended this. I did discuss placental abruption with her and that it can present after even benign injuries. Asked to  return with change of heart, leading, rupture of membranes, increasing pain, since contractions, or any other symptoms.  I discussed fetal demise, permanent disability, maternal complications.  Pt expressed understanding and [facility].       Rolland PorterMark Kita Neace, MD 05/21/16 [id number]    Rolland PorterMark Ariadne Rissmiller, MD 05/21/16 [phone]

## 2016-05-21 NOTE — Progress Notes (Signed)
Orthopedic Tech Progress Note Patient Details:  Perlie MayoMelissa F Oscar 01/22/1981 [id number]  Ortho Devices Type of Ortho Device: Buddy tape, Finger splint Ortho Device/Splint Location: LUE fingers Ortho Device/Splint Interventions: Ordered, Application   Jennye MoccasinHughes, Halford Goetzke Craig 05/21/2016, 11:05 PM

## 2016-05-21 NOTE — Discharge Instructions (Signed)
[facility] are [facility] and declining transfer to [facility] as advised by Dr. Richardson Doppole (OB) and Dr. Fayrene FearingJames (ER)  If you develop Vaginal Bleeding, Leakage of fluid, Abdominal pain, worsening feeling of contractions, then present to [facility] immediately.  Even minor falls can result in a Placenta Abruption.  This is where the placenta separates from the uterus, and baby does not get blood and oxygen.  This can result in fetal demise (death), or permanent damage to baby.  It can result in medical complications to mother.  Call Dr. Amanda PeaGramig (Hand surgeon) for follow up appointment regarding your finger fracture.

## 2016-05-21 NOTE — ED Triage Notes (Signed)
Pt states fell down 2 steps, landed on L arm. Pt complaining of L hand pain. Pt denies any abdominal pain or discharge. Pt is [redacted] weeks pregnant.

## 2016-05-21 NOTE — ED Provider Notes (Signed)
[facility] Provider Note   CSN: [id number] Arrival date & time: 05/21/16  2019     History   Chief Complaint Chief Complaint  Patient presents with  . Finger Injury  . Fall    HPI Perlie MayoMelissa F Alper is a 35 y.o. female with no significant pmhx. She is 6521w3d pregnant. Per patient was walking down steps, she had 2 steps and it was slightly wet from rain. Patient slipped and fell on her butt. She states she outstretched her left hand and most of the impact was on that hand. She now has been having swelling and pain in her hand, and can not move her 5th digit due to the pain. Patient denies an abdominal pain. Denies any impact of the fall on her abdomen. No vaginal bleeding. No contractions. Patient states she has been having braxton-hicks but this no abnormalities   HPI  Past Medical History:  Diagnosis Date  . Abnormal Pap smear   . Anxiety   . Headache(784.0)    otc meds prn - last one 2 wks ago  . PIH (pregnancy induced hypertension)    hx 3 yrs ag0 - no problems since delivery    Patient Active Problem List   Diagnosis Date Noted  . Chronic hypokalemia 02/28/2016  . Fall on same level from slipping, tripping and stumbling without subsequent striking against object, initial encounter 02/28/2016  . Allergy history, sulfonamide 12/28/2015  . Allergy to amoxicillin 12/28/2015  . History of premature delivery, currently pregnant 12/28/2015  . Obesity (BMI 30-39.9) 12/28/2015  . Pregnancy complicated by prior cervical conization, antepartum (with biopsy) 12/28/2015  . Hx of preeclampsia, prior pregnancy, currently pregnant 12/28/2015  . H/O gestational diabetes in prior pregnancy, currently pregnant 12/28/2015  . 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 Anxiety 12/13/2011    Past Surgical History:  Procedure Laterality Date  . CERVICAL CONIZATION W/BX  12/13/2011   Procedure: CONIZATION CERVIX WITH BIOPSY;  Surgeon: Hal MoralesVanessa P Haygood, MD;   Location: [facility];  Service: Gynecology;  Laterality: N/A;  Cold Knife  . CHOLECYSTECTOMY  2004  . COLONOSCOPY    . svd     x 2  . UPPER GASTROINTESTINAL ENDOSCOPY    . WISDOM TOOTH EXTRACTION      OB History    Gravida Para Term Preterm AB Living   3 2 1 1   2    SAB TAB Ectopic Multiple Live Births           2       Home Medications    Prior to Admission medications   Medication Sig Start Date End Date Taking? Authorizing Provider  acetaminophen (TYLENOL) 500 MG tablet Take 1,000 mg by mouth every 6 (six) hours as needed for moderate pain.     Historical Provider, MD  potassium chloride (K-DUR) 10 MEQ tablet Take 2 tablets (20 mEq total) by mouth 2 (two) times daily. 02/28/16   Alphonzo Severanceachel Stall, CNM  Prenatal Vit-Fe Fumarate-FA (PRENATAL MULTIVITAMIN) TABS tablet Take 1 tablet by mouth daily at 12 noon.    Historical Provider, MD  sertraline (ZOLOFT) 50 MG tablet Take 50 mg by mouth daily. 02/14/16   Historical Provider, MD    Family History History reviewed. No pertinent family history.  Social History Social History  Substance Use Topics  . Smoking status: Former Smoker    Packs/day: 0.50    Years: 3.00    Types: Cigarettes  . Smokeless tobacco: Never Used  Comment: quit 2005  . Alcohol use No     Comment: social/ none during pregnacy     Allergies   Sulfa antibiotics and Amoxicillin   Review of Systems Review of Systems  Constitutional: Negative for chills and fever.  Gastrointestinal: Negative for abdominal pain.  Genitourinary: Negative for dysuria and urgency.  Musculoskeletal: Positive for back pain.  All other systems reviewed and are negative.    Physical Exam Updated Vital Signs BP 145/86   Pulse 101   Temp 97.6 F (36.4 C) (Oral)   Resp 18   LMP 04/06/2015   SpO2 100%   Physical Exam  Constitutional: She is oriented to person, place, and time. She appears well-developed and well-nourished.  HENT:  Head: Normocephalic and atraumatic.    Eyes: Conjunctivae are normal.  Neck: Normal range of motion. Neck supple.  Cardiovascular: Normal rate, regular rhythm and normal heart sounds.   Pulmonary/Chest: Effort normal.  Abdominal: Soft. Bowel sounds are normal.  Musculoskeletal:       Hands: Neurological: She is alert and oriented to person, place, and time.  Skin: Skin is warm.     ED Treatments / Results  Labs (all labs ordered are listed, but only abnormal results are displayed) Labs Reviewed - No data to display  EKG  EKG Interpretation None       Radiology Dg Hand Complete Left  Result Date: 05/21/2016 CLINICAL DATA:  35 y/o F; fall tonight with pain and left hand. Mostly in the fifth digit which is difficult tube and. EXAM: LEFT HAND - COMPLETE 3+ VIEW COMPARISON:  None. FINDINGS: Dislocation of fifth proximal interphalangeal joint with volar displacement of the proximal phalanx relative to middle phalanx. Small osseous density anterior to proximal phalanx head probably represents a small avulsion fracture of the base of the middle phalanx. No additional fracture is identified. IMPRESSION: Dislocation of fifth proximal interphalangeal joint with volar displacement of the proximal phalanx relative to middle phalanx. Small osseous density anterior to proximal phalanx head probably represents a small avulsion fracture of the base of the middle phalanx. No additional fracture is identified. Electronically Signed   By: Mitzi Hansen M.D.   On: 05/21/2016 21:20    Procedures Procedures (including critical care time)  Medications Ordered in ED Medications - No data to display   Initial Impression / Assessment and Plan / ED Course  I have reviewed the triage vital signs and the nursing notes.  Pertinent labs & imaging results that were available during my care of the patient were reviewed by me and considered in my medical decision making (see chart for details).  Clinical Course   Patient presenting  following fall with dislocated 5th digit. This was reduced, following a digital block. X-ray showed avulsion fracture of fifth digit. Therefore patient was splinted. Patient noted to have contractions on tocometry. Therefore Dr. Richardson Dopp was contacted and indicated patient should be transferred to [facility] hospital. Patient indicated that she did not want to be transferred and signed out AMA.  Final Clinical Impressions(s) / ED Diagnoses   Final diagnoses:  None    New Prescriptions New Prescriptions   No medications on file     Jenevieve Kirschbaum Mayra Reel, MD 05/21/16 2346    Rolland Porter, MD 05/27/16 0005

## 2016-05-21 NOTE — ED Notes (Signed)
Rapid OB contacted. RN on her way. Pt in Xray at this moment.

## 2016-05-21 NOTE — Progress Notes (Signed)
Pt is a G3P2 @ 3927w3d [facility]. Pt states she fell down 4 steps on her back and L hand. L "pinky" is dislocated on xray. Pt states she has polyhydramnios with this pregnancy. Fetal heart tones are 130 bpm with good variability (intermitent tracing). Contractions every 3-6 minutes and mild. Patient states she is aware of them.   Maternal tracing from 2144 to 2152, while hand is being examined.

## 2016-05-22 ENCOUNTER — Observation Stay (HOSPITAL_COMMUNITY): Payer: Medicaid Other

## 2016-05-22 ENCOUNTER — Encounter (HOSPITAL_COMMUNITY): Payer: Self-pay | Admitting: *Deleted

## 2016-05-22 ENCOUNTER — Observation Stay (HOSPITAL_COMMUNITY)
Admission: AD | Admit: 2016-05-22 | Discharge: 2016-05-22 | Disposition: A | Discharge status: Home / Self Care | Payer: Medicaid Other | Source: Ambulatory Visit | Attending: Obstetrics and Gynecology | Admitting: Obstetrics and Gynecology

## 2016-05-22 DIAGNOSIS — F419 Anxiety disorder, unspecified: Secondary | ICD-10-CM | POA: Insufficient documentation

## 2016-05-22 DIAGNOSIS — O9A213 Injury, poisoning and certain other consequences of external causes complicating pregnancy, third trimester: Secondary | ICD-10-CM

## 2016-05-22 DIAGNOSIS — S62607S Fracture of unspecified phalanx of left little finger, sequela: Secondary | ICD-10-CM | POA: Insufficient documentation

## 2016-05-22 DIAGNOSIS — Z3A33 33 weeks gestation of pregnancy: Secondary | ICD-10-CM | POA: Insufficient documentation

## 2016-05-22 DIAGNOSIS — W010XXS Fall on same level from slipping, tripping and stumbling without subsequent striking against object, sequela: Secondary | ICD-10-CM | POA: Insufficient documentation

## 2016-05-22 DIAGNOSIS — Z880 Allergy status to penicillin: Secondary | ICD-10-CM | POA: Insufficient documentation

## 2016-05-22 DIAGNOSIS — O99343 Other mental disorders complicating pregnancy, third trimester: Secondary | ICD-10-CM | POA: Insufficient documentation

## 2016-05-22 DIAGNOSIS — Z87891 Personal history of nicotine dependence: Secondary | ICD-10-CM | POA: Insufficient documentation

## 2016-05-22 DIAGNOSIS — O26893 Other specified pregnancy related conditions, third trimester: Principal | ICD-10-CM | POA: Insufficient documentation

## 2016-05-22 LAB — URINE MICROSCOPIC-ADD ON

## 2016-05-22 LAB — COMPREHENSIVE METABOLIC PANEL
ALT: 22 U/L (ref 14–54)
ANION GAP: 7 (ref 5–15)
AST: 23 U/L (ref 15–41)
Albumin: 2.6 g/dL — ABNORMAL LOW (ref 3.5–5.0)
Alkaline Phosphatase: 101 U/L (ref 38–126)
BUN: 5 mg/dL — ABNORMAL LOW (ref 6–20)
CHLORIDE: 105 mmol/L (ref 101–111)
CO2: 22 mmol/L (ref 22–32)
CREATININE: 0.47 mg/dL (ref 0.44–1.00)
Calcium: 8.1 mg/dL — ABNORMAL LOW (ref 8.9–10.3)
GFR calc non Af Amer: >60 mL/min (ref >60)
Glucose, Bld: 157 mg/dL — ABNORMAL HIGH (ref 65–99)
Potassium: 2.9 mmol/L — ABNORMAL LOW (ref 3.5–5.1)
SODIUM: 134 mmol/L — AB (ref 135–145)
Total Bilirubin: 0.5 mg/dL (ref 0.3–1.2)
Total Protein: 5.8 g/dL — ABNORMAL LOW (ref 6.5–8.1)

## 2016-05-22 LAB — URINALYSIS, ROUTINE W REFLEX MICROSCOPIC
BILIRUBIN URINE: NEGATIVE
Glucose, UA: NEGATIVE mg/dL
Ketones, ur: NEGATIVE mg/dL
Leukocytes, UA: NEGATIVE
NITRITE: NEGATIVE
PROTEIN: NEGATIVE mg/dL
Specific Gravity, Urine: 1.015 (ref 1.005–1.030)
pH: 7 (ref 5.0–8.0)

## 2016-05-22 LAB — CBC
HCT: 32.2 % — ABNORMAL LOW (ref 36.0–46.0)
Hemoglobin: 10.7 g/dL — ABNORMAL LOW (ref 12.0–15.0)
MCH: 30.1 pg (ref 26.0–34.0)
MCHC: 33.2 g/dL (ref 30.0–36.0)
MCV: 90.7 fL (ref 78.0–100.0)
PLATELETS: 197 10*3/uL (ref 150–400)
RBC: 3.55 MIL/uL — AB (ref 3.87–5.11)
RDW: 13.5 % (ref 11.5–15.5)
WBC: 13.9 10*3/uL — AB (ref 4.0–10.5)

## 2016-05-22 LAB — TYPE AND SCREEN
ABO/RH(D): O POS
Antibody Screen: NEGATIVE

## 2016-05-22 LAB — KLEIHAUER-BETKE STAIN
# Vials RhIg: 1
FETAL CELLS %: 0 %
QUANTITATION FETAL HEMOGLOBIN: 0 mL

## 2016-05-22 LAB — FIBRINOGEN: FIBRINOGEN: 433 mg/dL (ref 210–475)

## 2016-05-22 LAB — PROTIME-INR
INR: 1.04
PROTHROMBIN TIME: 13.6 s (ref 11.4–15.2)

## 2016-05-22 LAB — ABO/RH: ABO/RH(D): O POS

## 2016-05-22 LAB — APTT: aPTT: 25 seconds (ref 24–36)

## 2016-05-22 MED ORDER — SERTRALINE HCL 50 MG PO TABS
50.0000 mg | ORAL_TABLET | Freq: Every day | ORAL | Status: DC
  Filled 2016-05-22: qty 1

## 2016-05-22 MED ORDER — CALCIUM CARBONATE ANTACID 500 MG PO CHEW: 2.0000 | CHEWABLE_TABLET | ORAL | Status: DC | PRN

## 2016-05-22 MED ORDER — ACETAMINOPHEN 325 MG PO TABS: 650.0000 mg | ORAL_TABLET | ORAL | Status: DC | PRN

## 2016-05-22 MED ORDER — NIFEDIPINE 10 MG PO CAPS: 10.0000 mg | ORAL_CAPSULE | Freq: Four times a day (QID) | ORAL | Status: DC | PRN

## 2016-05-22 MED ORDER — CYCLOBENZAPRINE HCL 10 MG PO TABS: 10.0000 mg | ORAL_TABLET | Freq: Three times a day (TID) | ORAL | 0 refills | Status: DC | PRN

## 2016-05-22 MED ORDER — PRENATAL MULTIVITAMIN CH: 1.0000 | ORAL_TABLET | Freq: Every day | ORAL | Status: DC

## 2016-05-22 MED ORDER — ZOLPIDEM TARTRATE 5 MG PO TABS: 5.0000 mg | ORAL_TABLET | Freq: Every evening | ORAL | Status: DC | PRN

## 2016-05-22 MED ORDER — HYDROCODONE-ACETAMINOPHEN 5-325 MG PO TABS
1.0000 | ORAL_TABLET | ORAL | Status: DC | PRN
  Administered 2016-05-22: 1 via ORAL
  Filled 2016-05-22: qty 1

## 2016-05-22 MED ORDER — DOCUSATE SODIUM 100 MG PO CAPS: 100.0000 mg | ORAL_CAPSULE | Freq: Every day | ORAL | Status: DC

## 2016-05-22 NOTE — Progress Notes (Addendum)
Patient sleeping soundly.  FHR Category 1 UCs occasional mild irritability  Results for orders placed or performed during the hospital encounter of 05/22/16 (from the past 24 hour(s))  Type and screen [facility]     Status: None   Collection Time: 05/22/16  2:53 AM  Result Value Ref Range   ABO/RH(D) O POS    Antibody Screen NEG    Sample Expiration 05/25/2016   CBC on admission     Status: Abnormal   Collection Time: 05/22/16  2:53 AM  Result Value Ref Range   WBC 13.9 (H) 4.0 - 10.5 K/uL   RBC 3.55 (L) 3.87 - 5.11 MIL/uL   Hemoglobin 10.7 (L) 12.0 - 15.0 g/dL   HCT 47.832.2 (L) 29.536.0 - 62.146.0 %   MCV 90.7 78.0 - 100.0 fL   MCH 30.1 26.0 - 34.0 pg   MCHC 33.2 30.0 - 36.0 g/dL   RDW 30.813.5 65.711.5 - 84.615.5 %   Platelets 197 150 - 400 K/uL  Comprehensive metabolic panel     Status: Abnormal   Collection Time: 05/22/16  2:53 AM  Result Value Ref Range   Sodium 134 (L) 135 - 145 mmol/L   Potassium 2.9 (L) 3.5 - 5.1 mmol/L   Chloride 105 101 - 111 mmol/L   CO2 22 22 - 32 mmol/L   Glucose, Bld 157 (H) 65 - 99 mg/dL   BUN 5 (L) 6 - 20 mg/dL   Creatinine, Ser 9.620.47 0.44 - 1.00 mg/dL   Calcium 8.1 (L) 8.9 - 10.3 mg/dL   Total Protein 5.8 (L) 6.5 - 8.1 g/dL   Albumin 2.6 (L) 3.5 - 5.0 g/dL   AST 23 15 - 41 U/L   ALT 22 14 - 54 U/L   Alkaline Phosphatase 101 38 - 126 U/L   Total Bilirubin 0.5 0.3 - 1.2 mg/dL   GFR calc non Af Amer >60 >60 mL/min   GFR calc Af Amer >60 >60 mL/min   Anion gap 7 5 - 15  APTT     Status: None   Collection Time: 05/22/16  2:53 AM  Result Value Ref Range   aPTT 25 24 - 36 seconds  Protime-INR     Status: None   Collection Time: 05/22/16  2:53 AM  Result Value Ref Range   Prothrombin Time 13.6 11.4 - 15.2 seconds   INR 1.04   Fibrinogen     Status: None   Collection Time: 05/22/16  2:53 AM  Result Value Ref Range   Fibrinogen 433 210 - 475 mg/dL   KLB and T&S pending.  US:  Vtx, anterior placenta, AFI 16.44, 59%ile.  Plan: Continue  to observe--anticipate 23 hour observation. Await KLB results.  Nigel BridgemanVicki Argentina Kosch ,CNM 05/22/16 5:06a  Addendum: KLB negative.  Nigel BridgemanVicki Ashan Cueva, CNM 05/22/16 [phone]

## 2016-05-22 NOTE — Discharge Summary (Signed)
Physician Discharge Summary  Patient ID: [name] MRN: [id number] DOB/AGE: 04-09-1981 35 y.o.  Admit date: 05/22/2016 Discharge date: 05/22/2016  Admission Diagnoses: 33 4/7wks S/p fall with broken pinky  Discharge Diagnoses:  Principal Problem:   Traumatic injury during pregnancy in third trimester   Discharged Condition: stable  Hospital Course: Pt was admitted s/p fall and a broken pinky.  Tracing was cat 1 and rare BH contraction.  Pt had no complaints upon discharge.  She had been cleared by the ER and ORTHO and pt needs to f/u with ortho as they recommended.  BPP 8/8, nl fluid.  Consults: None  Significant Diagnostic Studies: ultrasound  Treatments: IV hydration and observation  Discharge Exam: Blood pressure 116/63, pulse 93, temperature 97.8 F (36.6 C), temperature source Oral, resp. rate 18, height 5\' 3"  (1.6 m), weight 210 lb (95.3 kg), last menstrual period 04/06/2015. General appearance: alert and no distress Resp: clear to auscultation bilaterally Cardio: regular rate and rhythm Extremities: no calf tenderness  Abdomen: gravid, NT and no obvious abrasions  Disposition: 01-Home or Self Care     Medication List    TAKE these medications   acetaminophen 500 MG tablet Commonly known as:  TYLENOL Take 1,000 mg by mouth every 6 (six) hours as needed for moderate pain.   calcium carbonate 500 MG chewable tablet Commonly known as:  TUMS - dosed in mg elemental calcium Chew 2 tablets by mouth 2 (two) times daily as needed for indigestion or heartburn.   cyclobenzaprine 10 MG tablet Commonly known as:  FLEXERIL Take 1 tablet (10 mg total) by mouth every 8 (eight) hours as needed for muscle spasms.   ferrous sulfate 325 (65 FE) MG tablet Take 325 mg by mouth daily.   pantoprazole 40 MG tablet Commonly known as:  PROTONIX Take 40 mg by mouth daily.   potassium chloride 10 MEQ tablet Commonly known as:  K-DUR Take 2 tablets (20 mEq total) by mouth 2  (two) times daily.   prenatal multivitamin Tabs tablet Take 1 tablet by mouth daily at 12 noon.      Follow-up Information    [facility]. Schedule an appointment as soon as possible for a visit in 1 week(s).   Specialty:  Obstetrics and Gynecology Contact information: [address] [place] [phone]          Signed: Purcell NailsROBERTS,Rasheen Schewe Y 05/22/2016, 5:50 PM

## 2016-05-22 NOTE — H&P (Signed)
[name] is a 35 y.o. female, [id number] at 82 4/7 weeks, presenting for admission s/p fall around 7pm with dislocation and fracture of 5th digit.  Was seen at [facility] for evaluation, with assessment by Rapid Response RN.  FHR was Category 1, with initial contractions q 3 min, but mild.  Cervix was closed, 50%, vtx, ballotable.  She received single dose of Procardia in ER at 2330, and declined IV hydration.  She was to be transferred via [facility] to [facility] for further monitoring, but patient advised she needed to go home and check on her children prior to admission to [facility].  She promised she would return to [facility] after that--she arrived at [facility] for admission around 2:15am.  She reports previous contractions have significantly diminished, denies bleeding or leaking, reports +FM.  Reports pain in left hand s/p manual reduction/ of dislocation.    She is to be admitted for continuous EFM, OB US, labs, and Procardia prn, and observation.  Patient Active Problem List   Diagnosis Date Noted  . Traumatic injury during pregnancy in third trimester 05/22/2016  . Chronic hypokalemia 02/28/2016  . Fall on same level from slipping, tripping and stumbling without subsequent striking against object, initial encounter 02/28/2016  . Allergy history, sulfonamide 12/28/2015  . Allergy to amoxicillin 12/28/2015  . History of premature delivery, currently pregnant 12/28/2015  . Obesity (BMI 30-39.9) 12/28/2015  . Pregnancy complicated by prior cervical conization, antepartum (with biopsy) 12/28/2015  . Hx of preeclampsia, prior pregnancy, currently pregnant 12/28/2015  . H/O gestational diabetes in prior pregnancy, currently pregnant 12/28/2015  . 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 Anxiety 12/13/2011    History of present pregnancy: Patient entered care at 7 3/7 weeks.   EDC of 07/06/16 was established by LMP.   Anatomy scan:  19 4/7 weeks, with limited anatomy and an anterior  placenta.  Additional [place] evaluations:   22 3/7 weeks--completion of anatomy 04/30/16--EFW 4+2, 1879 gm, 87%ile, polyhydramnios, AFI 25.6, 97%ile, vtx 05/07/16--31 3/7 weeks, AFI 24.47, 95%ile, vtx.  Significant prenatal events: Normal genetic testing.  Some issues with chronic anxiety--Rx'd Buspar, then changed to Welbutrin, then Zoloft at 21 weeks.  PIH labs done as baseline at 12 5/7 weeks in [facility]U during episode of anxiety, with slightly elevated BPs.  All testing was WNL.   On Kdur for a short time during pregnancy for chronic hypokalemia.  Fell at 22 weeks, seen in MAU.  Polyhydramnios dx at 30 weeks, being followed with weekly BPPs now.  Repeat PIH labs at 30 weeks due to elevated BP--all WNL.  Referred to Urgent Care for facial rash.   Last evaluation:  05/15/16--BP 12/68, weight 219, BPP 8/8 per notes, polyhydramnios still present.  OB History    Gravida Para Term Preterm AB Living   3 2 1 1   2    SAB TAB Ectopic Multiple Live Births           2    2003--SVB, 34 weeks, 3 hour labor, 4+5, female, epidural, PROM, delivered with [facility] 2010--SVB, 39 weeks, 3 hour labor, 7+2, epidural, female, gest DM, pre-eclampsia, delivered with [facility]  Past Medical History:  Diagnosis Date  . Abnormal Pap smear   . Anxiety   . Headache(784.0)    otc meds prn - last one 2 wks ago  . PIH (pregnancy induced hypertension)    hx 3 yrs ag0 - no problems since delivery   Past Surgical History:  Procedure Laterality Date  .  CERVICAL CONIZATION W/BX  12/13/2011   Procedure: CONIZATION CERVIX WITH BIOPSY;  Surgeon: Hal MoralesVanessa P Haygood, MD;  Location: [facility];  Service: Gynecology;  Laterality: N/A;  Cold Knife  . CHOLECYSTECTOMY  2004  . COLONOSCOPY    . svd     x 2  . UPPER GASTROINTESTINAL ENDOSCOPY    . WISDOM TOOTH EXTRACTION     Family History: Mother diabetes; 2nd cousin with spina bifida  Social History:  reports that she has quit smoking. Her smoking use included Cigarettes. She has a 1.50 pack-year  smoking history. She has never used smokeless tobacco. She reports that she does not drink alcohol or use drugs.  She is Caucasian, of the [employer] faith, has 2 years college, employed as a Leisure centre managerbartender.    Home Meds: Zoloft 50 mg po daily. PNV   Prenatal Transfer Tool  Maternal Diabetes: No Genetic Screening: Normal 1st trimester screen and AFP Maternal Ultrasounds/Referrals: Abnormal:  Findings:   Other:Polyhydramnios Fetal Ultrasounds or other Referrals:  None Maternal Substance Abuse:  No Significant Maternal Medications:  None Significant Maternal Lab Results: Lab values include: Other: GBS unknown  TDAP 04/16/16 Flu NA  ROS:  Pain in left hand, occasional UCs, +FM.  Allergies  Allergen Reactions  . Sulfa Antibiotics Rash    fever  . Amoxicillin Rash    High fever Has patient had a PCN reaction causing immediate rash, facial/tongue/throat swelling, SOB or lightheadedness with hypotension: no Has patient had a PCN reaction causing severe rash involving mucus membranes or skin necrosis: no Has patient had a PCN reaction that required hospitalization no Has patient had a PCN reaction occurring within the last 10 years: no If all of the above answers are "NO", then may proceed with Cephalosporin use.        Blood pressure (!) 141/75, pulse (!) 101, temperature 97.8 F (36.6 C), temperature source Oral, resp. rate 20, last menstrual period 04/06/2015.  Chest clear Heart RRR without murmur Abd gravid, NT, FH 35 v, Pelvic: closed, soft, thick by Rapid Response exam Ext: WNL  FHR: Category 1 UCs:  Irritability  Prenatal labs: ABO, Rh: O/Positive/-- (03/14 0000) Antibody: Negative (03/14 0000) Rubella:  Immune RPR: Nonreactive (03/14 0000)  HBsAg: Negative (03/14 0000)  HIV: Non-reactive (03/14 0000)  GBS:  Unknown  Pap:  WNL 12/2015 GC:  Neg 11/2015 Chlamydia: Neg 11/2015 Genetic screenings:  Normal 1st trimester screen and AFP Glucola:  WNL Other:   Hgb 14.5 at  NOB, 11.2 at 28 weeks PIH w/u 04/30/16, with PCR--all WNL   Assessment/Plan: IUP at 33 4/7 weeks S/p fall, with dislocation of left 5th finger Anxiety/depression--no longer taking Zoloft, stable Hx pre-eclampsia Allergies to Amoxicillin and sulfa  Plan: Admit to Antenatal care per consult with Dr. Richardson Doppole CBC, CMP, KLB, coags Continuous EFM OB US and BPP--r/o placental abruption Patient declined IV at ER, agreeable with access if needed Procardia 10 mg po q 6 hours prn Pain med prn for finger/hand pain.  Nigel BridgemanLATHAM, [name] CNM, [place] 05/22/2016, 2:53 AM

## 2016-05-22 NOTE — Progress Notes (Signed)
Inpatient Diabetes Program Recommendations  Diabetes Treatment Program Recommendations  ADA Standards of Care 2016 Diabetes in Pregnancy Target Glucose Ranges:  Fasting: 60 - 90 mg/dL Preprandial: 60 - 161105 mg/dL 1 hr postprandial: Less than 140mg /dL (from first bite of meal) 2 hr postprandial: Less than 120 mg/dL (from first bite of meal)    Results for [name], [name] (MRN [id number]) as of 05/22/2016 08:04  Ref. Range 05/22/2016 02:53  Glucose Latest Ref Range: 65 - 99 mg/dL [id number] (H)   Review of Glycemic Control  Diabetes history: No Outpatient Diabetes medications: NA Current orders for Inpatient glycemic control: None  Inpatient Diabetes Program Recommendations: Correction (SSI): No noted history of diabetes. Lab glucose 157 mg/dl at 9:142:53 am today. May want to consider monitoring CBGs QID (fasting and 2 hour post prandial) and if glucose above target goals then consider using Diabetic Pregnant Patient order set to order Novolog correction QID (fasting and 2 hour post prandial).  Thanks, Orlando PennerMarie Halena Mohar, RN, MSN, CDE Diabetes Coordinator Inpatient Diabetes Program [phone] (Team Pager from 8am to 5pm) [phone] ([facility] office) [phone] [facility] office) [phone] [facility] office)

## 2016-05-22 NOTE — Progress Notes (Signed)
Patient is threatening to leave AMA. Dr. Su Hiltoberts is in OR and to see patient when surgery is over. Patient agreed to stay until MD can see her.

## 2016-06-26 ENCOUNTER — Encounter (HOSPITAL_COMMUNITY): Payer: Self-pay | Admitting: *Deleted

## 2016-06-26 ENCOUNTER — Telehealth (HOSPITAL_COMMUNITY): Payer: Self-pay | Admitting: *Deleted

## 2016-06-26 NOTE — Telephone Encounter (Signed)
Preadmission screen  

## 2016-07-01 ENCOUNTER — Encounter (HOSPITAL_COMMUNITY): Payer: Self-pay

## 2016-07-01 ENCOUNTER — Inpatient Hospital Stay (HOSPITAL_COMMUNITY)
Admission: AD | Admit: 2016-07-01 | Discharge: 2016-07-05 | DRG: 765 | Disposition: A | Discharge status: Home / Self Care | Payer: Medicaid Other | Source: Ambulatory Visit | Attending: Obstetrics & Gynecology | Admitting: Obstetrics & Gynecology

## 2016-07-01 DIAGNOSIS — E876 Hypokalemia: Secondary | ICD-10-CM | POA: Diagnosis present

## 2016-07-01 DIAGNOSIS — Z87891 Personal history of nicotine dependence: Secondary | ICD-10-CM | POA: Diagnosis not present

## 2016-07-01 DIAGNOSIS — Z8249 Family history of ischemic heart disease and other diseases of the circulatory system: Secondary | ICD-10-CM

## 2016-07-01 DIAGNOSIS — O9081 Anemia of the puerperium: Secondary | ICD-10-CM | POA: Diagnosis present

## 2016-07-01 DIAGNOSIS — Z8751 Personal history of pre-term labor: Secondary | ICD-10-CM

## 2016-07-01 DIAGNOSIS — O99344 Other mental disorders complicating childbirth: Secondary | ICD-10-CM | POA: Diagnosis present

## 2016-07-01 DIAGNOSIS — D62 Acute posthemorrhagic anemia: Secondary | ICD-10-CM | POA: Diagnosis present

## 2016-07-01 DIAGNOSIS — Z833 Family history of diabetes mellitus: Secondary | ICD-10-CM

## 2016-07-01 DIAGNOSIS — O09899 Supervision of other high risk pregnancies, unspecified trimester: Secondary | ICD-10-CM

## 2016-07-01 DIAGNOSIS — Z882 Allergy status to sulfonamides status: Secondary | ICD-10-CM

## 2016-07-01 DIAGNOSIS — F419 Anxiety disorder, unspecified: Secondary | ICD-10-CM | POA: Diagnosis present

## 2016-07-01 DIAGNOSIS — O99214 Obesity complicating childbirth: Secondary | ICD-10-CM | POA: Diagnosis present

## 2016-07-01 DIAGNOSIS — O403XX0 Polyhydramnios, third trimester, not applicable or unspecified: Secondary | ICD-10-CM | POA: Diagnosis present

## 2016-07-01 DIAGNOSIS — E669 Obesity, unspecified: Secondary | ICD-10-CM | POA: Diagnosis present

## 2016-07-01 DIAGNOSIS — Z880 Allergy status to penicillin: Secondary | ICD-10-CM | POA: Diagnosis not present

## 2016-07-01 DIAGNOSIS — O134 Gestational [pregnancy-induced] hypertension without significant proteinuria, complicating childbirth: Secondary | ICD-10-CM | POA: Diagnosis present

## 2016-07-01 DIAGNOSIS — Z3A39 39 weeks gestation of pregnancy: Secondary | ICD-10-CM | POA: Diagnosis not present

## 2016-07-01 DIAGNOSIS — O133 Gestational [pregnancy-induced] hypertension without significant proteinuria, third trimester: Secondary | ICD-10-CM | POA: Diagnosis present

## 2016-07-01 DIAGNOSIS — Z6839 Body mass index (BMI) 39.0-39.9, adult: Secondary | ICD-10-CM

## 2016-07-01 DIAGNOSIS — O1493 Unspecified pre-eclampsia, third trimester: Secondary | ICD-10-CM | POA: Diagnosis not present

## 2016-07-01 DIAGNOSIS — O09219 Supervision of pregnancy with history of pre-term labor, unspecified trimester: Secondary | ICD-10-CM

## 2016-07-01 DIAGNOSIS — O409XX0 Polyhydramnios, unspecified trimester, not applicable or unspecified: Secondary | ICD-10-CM | POA: Diagnosis present

## 2016-07-01 DIAGNOSIS — O3663X0 Maternal care for excessive fetal growth, third trimester, not applicable or unspecified: Secondary | ICD-10-CM | POA: Diagnosis present

## 2016-07-01 DIAGNOSIS — O139 Gestational [pregnancy-induced] hypertension without significant proteinuria, unspecified trimester: Secondary | ICD-10-CM | POA: Diagnosis present

## 2016-07-01 DIAGNOSIS — O9A213 Injury, poisoning and certain other consequences of external causes complicating pregnancy, third trimester: Secondary | ICD-10-CM | POA: Diagnosis present

## 2016-07-01 DIAGNOSIS — O09299 Supervision of pregnancy with other poor reproductive or obstetric history, unspecified trimester: Secondary | ICD-10-CM

## 2016-07-01 DIAGNOSIS — O24410 Gestational diabetes mellitus in pregnancy, diet controlled: Secondary | ICD-10-CM

## 2016-07-01 DIAGNOSIS — Z302 Encounter for sterilization: Secondary | ICD-10-CM

## 2016-07-01 DIAGNOSIS — Z8632 Personal history of gestational diabetes: Secondary | ICD-10-CM

## 2016-07-01 HISTORY — DX: Gestational (pregnancy-induced) hypertension without significant proteinuria, unspecified trimester: O13.9

## 2016-07-01 LAB — COMPREHENSIVE METABOLIC PANEL
ALBUMIN: 2.6 g/dL — AB (ref 3.5–5.0)
ALK PHOS: 132 U/L — AB (ref 38–126)
ALT: 14 U/L (ref 14–54)
AST: 20 U/L (ref 15–41)
Anion gap: 9 (ref 5–15)
BILIRUBIN TOTAL: 0.5 mg/dL (ref 0.3–1.2)
BUN: 5 mg/dL — AB (ref 6–20)
CALCIUM: 8.5 mg/dL — AB (ref 8.9–10.3)
CO2: 20 mmol/L — AB (ref 22–32)
Chloride: 106 mmol/L (ref 101–111)
Creatinine, Ser: 0.48 mg/dL (ref 0.44–1.00)
GFR calc Af Amer: >60 mL/min (ref >60)
GFR calc non Af Amer: >60 mL/min (ref >60)
Glucose, Bld: 104 mg/dL — ABNORMAL HIGH (ref 65–99)
Potassium: 3.6 mmol/L (ref 3.5–5.1)
Sodium: 135 mmol/L (ref 135–145)
TOTAL PROTEIN: 6.1 g/dL — AB (ref 6.5–8.1)

## 2016-07-01 LAB — CBC
HEMATOCRIT: 36.4 % (ref 36.0–46.0)
HEMOGLOBIN: 12 g/dL (ref 12.0–15.0)
MCH: 29.9 pg (ref 26.0–34.0)
MCHC: 33 g/dL (ref 30.0–36.0)
MCV: 90.8 fL (ref 78.0–100.0)
Platelets: 183 10*3/uL (ref 150–400)
RBC: 4.01 MIL/uL (ref 3.87–5.11)
RDW: 14.4 % (ref 11.5–15.5)
WBC: 9.8 10*3/uL (ref 4.0–10.5)

## 2016-07-01 LAB — OB RESULTS CONSOLE GBS: STREP GROUP B AG: NEGATIVE

## 2016-07-01 LAB — PROTEIN / CREATININE RATIO, URINE
Creatinine, Urine: 147 mg/dL
Protein Creatinine Ratio: 0.19 mg/mg{Cre} — ABNORMAL HIGH (ref 0.00–0.15)
Total Protein, Urine: 28 mg/dL

## 2016-07-01 MED ORDER — FENTANYL CITRATE (PF) 100 MCG/2ML IJ SOLN
50.0000 ug | INTRAMUSCULAR | Status: DC | PRN
  Administered 2016-07-02 (×3): 100 ug via INTRAVENOUS
  Filled 2016-07-01 (×3): qty 2

## 2016-07-01 MED ORDER — LIDOCAINE HCL (PF) 1 % IJ SOLN: 30.0000 mL | INTRAMUSCULAR | Status: DC | PRN

## 2016-07-01 MED ORDER — LACTATED RINGERS IV SOLN: 500.0000 mL | INTRAVENOUS | Status: DC | PRN

## 2016-07-01 MED ORDER — ONDANSETRON HCL 4 MG/2ML IJ SOLN: 4.0000 mg | Freq: Four times a day (QID) | INTRAMUSCULAR | Status: DC | PRN

## 2016-07-01 MED ORDER — MISOPROSTOL 25 MCG QUARTER TABLET
25.0000 ug | ORAL_TABLET | ORAL | Status: DC | PRN
  Administered 2016-07-01 – 2016-07-02 (×2): 25 ug via VAGINAL
  Filled 2016-07-01 (×2): qty 0.25

## 2016-07-01 MED ORDER — LABETALOL HCL 5 MG/ML IV SOLN: 20.0000 mg | INTRAVENOUS | Status: DC | PRN

## 2016-07-01 MED ORDER — OXYTOCIN 40 UNITS IN LACTATED RINGERS INFUSION - SIMPLE MED: 2.5000 [IU]/h | INTRAVENOUS | Status: DC

## 2016-07-01 MED ORDER — TERBUTALINE SULFATE 1 MG/ML IJ SOLN: 0.2500 mg | Freq: Once | INTRAMUSCULAR | Status: DC | PRN

## 2016-07-01 MED ORDER — SOD CITRATE-CITRIC ACID 500-334 MG/5ML PO SOLN
30.0000 mL | ORAL | Status: DC | PRN
  Administered 2016-07-03: 30 mL via ORAL
  Filled 2016-07-01: qty 15

## 2016-07-01 MED ORDER — OXYTOCIN 40 UNITS IN LACTATED RINGERS INFUSION - SIMPLE MED
1.0000 m[IU]/min | INTRAVENOUS | Status: DC
  Administered 2016-07-02: 2 m[IU]/min via INTRAVENOUS
  Filled 2016-07-01: qty 1000

## 2016-07-01 MED ORDER — LACTATED RINGERS IV SOLN
INTRAVENOUS | Status: DC
  Administered 2016-07-01 – 2016-07-03 (×8): via INTRAVENOUS

## 2016-07-01 MED ORDER — OXYTOCIN BOLUS FROM INFUSION: 500.0000 mL | Freq: Once | INTRAVENOUS | Status: DC

## 2016-07-01 MED ORDER — HYDRALAZINE HCL 20 MG/ML IJ SOLN: 10.0000 mg | Freq: Once | INTRAMUSCULAR | Status: DC | PRN

## 2016-07-01 MED ORDER — ACETAMINOPHEN 325 MG PO TABS: 650.0000 mg | ORAL_TABLET | ORAL | Status: DC | PRN

## 2016-07-01 NOTE — H&P (Signed)
[name] is a 35 y.o. female, G3P1102 at 39.2 weeks, presenting for IOL secondary to [facility]. Patient seen in the office today and elevated BP noted.  Despite normal PIH labs, during MAU evaluation, BP remain elevated and patient admitted.   Patient pregnancy and medical history as noted below.  Patient desires epidural for pain management and permanent sterilization in immediate PPP.   Patient Active Problem List   Diagnosis Date Noted  . Gestational hypertension w/o significant proteinuria in 3rd trimester 07/01/2016  . Gestational HTN, third trimester 07/01/2016  . Polyhydramnios 07/01/2016  . Request for sterilization 07/01/2016  . Traumatic injury during pregnancy in third trimester 05/22/2016  . Chronic hypokalemia 02/28/2016  . Fall on same level from slipping, tripping and stumbling without subsequent striking against object, initial encounter 02/28/2016  . Allergy history, sulfonamide 12/28/2015  . Allergy to amoxicillin 12/28/2015  . History of premature delivery, currently pregnant 12/28/2015  . Obesity (BMI 30-39.9) 12/28/2015  . Pregnancy complicated by prior cervical conization, antepartum (with biopsy) 12/28/2015  . Hx of preeclampsia, prior pregnancy, currently pregnant 12/28/2015  . H/O gestational diabetes in prior pregnancy, currently pregnant 12/28/2015  . 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 Anxiety 12/13/2011    History of present pregnancy: Patient entered care at 7 3/7 weeks.   EDC of 07/06/16 was established by LMP  Anatomy scan:  19 4/7 weeks, with limited anatomy and an anterior placenta.  Additional [place] evaluations:   22 3/7 weeks--completion of anatomy 04/30/16--EFW 4+2, 1879 gm, 87%ile, polyhydramnios, AFI 25.6, 97%ile, vtx 05/07/16--31 3/7 weeks, AFI 24.47, 95%ile, vtx.    05/15/16--BPP 8/8 per notes, polyhydramnios still present 05/28/16-- U/S: SIUP. VERTEX. FLUID= 75TH%. EFW= 95TH % BPD= 98TH%, AC= 98TH% BPP=8/8 06/04/16--BPP  8/8 06/18/16--BPP: single, vertex, AT placenta, fluid normal, AFI 65th%, cx not seen per protocol, bpp 8/8, 06/25/16-- BPP 8/8 EFW 80% AFI=80%,  EFW 3691-8lbs 2oz (80th%ile)  Significant prenatal events: -Normal genetic testing.  Some issues with chronic anxiety--Rx'd Buspar, then changed to Welbutrin, then Zoloft at 21 weeks.   -[place] labs done as baseline at 12 5/7 weeks in MAU during episode of anxiety, with slightly elevated BPs.  All testing was WNL.  -On Kdur for a short time during pregnancy for chronic hypokalemia.   -Fell at 22 weeks, seen in MAU.   -Polyhydramnios dx at 30 weeks, being followed with weekly BPPs now.   -Repeat PIH labs at 30 weeks due to elevated BP--all WNL.  Referred to Urgent Care for facial rash.   -Admitted 9/13-9/14 s/p fall, with dislocation of left 5th finger. Observed overnight, received Procardia prn. Normal limited US, vtx, AFI 16.44, 59%ile, BPP 8/8. -Patient demands IOL and threatens to sue practice if demands not met.  Last evaluation: 07/01/2016 by Dr. Gillermo Murdoch.  BP 150/78 & 160/104.  C/O dizziness, HA, and lightheadedness.   OB History    Gravida Para Term Preterm AB Living   3 2 1 1   2    SAB TAB Ectopic Multiple Live Births           2    2003--SVB, 34 weeks, 3 hour labor, 4+5, female, epidural, PROM, delivered with [facility] 2010--SVB, 39 weeks, 3 hour labor, 7+2, epidural, female, gest DM, pre-eclampsia, delivered with [facility]  Past Medical History:  Diagnosis Date  . Abnormal Pap smear   . Anxiety   . Headache(784.0)    otc meds prn - last one 2 wks ago  . PIH (pregnancy induced  hypertension)    hx 3 yrs ag0 - no problems since delivery  . Vaginal Pap smear, abnormal    Past Surgical History:  Procedure Laterality Date  . CERVICAL CONIZATION W/BX  12/13/2011   Procedure: CONIZATION CERVIX WITH BIOPSY;  Surgeon: Eldred Manges, MD;  Location: [facility];  Service: Gynecology;  Laterality: N/A;  Cold Knife  . CHOLECYSTECTOMY  2004  . COLONOSCOPY    .  svd     x 2  . UPPER GASTROINTESTINAL ENDOSCOPY    . WISDOM TOOTH EXTRACTION     Family History: family history includes Diabetes in her maternal aunt and maternal grandfather; Hypertension in her maternal grandfather. Social History:  reports that she has quit smoking. Her smoking use included Cigarettes. She has a 1.50 pack-year smoking history. She has never used smokeless tobacco. She reports that she does not drink alcohol or use drugs.  She is Caucasian, of the [employer], has 2 years college, employed as a Chief Operating Officer.     Prenatal Transfer Tool  Maternal Diabetes: No Genetic Screening: Normal 1st trimester screen and AFP Maternal Ultrasounds/Referrals: Abnormal:  Findings:   Other: Polyhydramnios Fetal Ultrasounds or other Referrals:  None Maternal Substance Abuse:  No Significant Maternal Medications:  Meds include: Zoloft Significant Maternal Lab Results: Lab values include: Group B Strep negative  TDAP 04/16/16 Flu NA  ROS:  +HA, +FM, +Occ Ctx, -LoF, -VB  Allergies  Allergen Reactions  . Sulfa Antibiotics Rash    fever  . Amoxicillin Rash    High fever Has patient had a PCN reaction causing immediate rash, facial/tongue/throat swelling, SOB or lightheadedness with hypotension: no Has patient had a PCN reaction causing severe rash involving mucus membranes or skin necrosis: no Has patient had a PCN reaction that required hospitalization no Has patient had a PCN reaction occurring within the last 10 years: no If all of the above answers are "NO", then may proceed with Cephalosporin use.        Blood pressure 158/96, pulse 92, temperature 98.2 F (36.8 C), temperature source Oral, resp. rate 17, height 5' 3"  (1.6 m), weight 99.3 kg (219 lb), last menstrual period 04/06/2015, SpO2 100 %.  Physical Exam  Vitals reviewed. Constitutional: She is oriented to person, place, and time. No distress.  Obese  HENT:  Head: Normocephalic and atraumatic.  Eyes: Conjunctivae  are normal.  Neck: Normal range of motion.  Cardiovascular: Normal rate, regular rhythm and normal heart sounds.   Respiratory: Effort normal and breath sounds normal.  GI: Soft. Bowel sounds are normal.  Gravid, Fundus LGA  Musculoskeletal: Normal range of motion. She exhibits no edema.  Neurological: She is alert and oriented to person, place, and time.  Skin: Skin is warm and dry.  Psychiatric: She has a normal mood and affect. Her behavior is normal.    [place]: EFW: 8lb 2oz by 38wk [place] Presentation: Vertex via [place] 07/01/16 in office  FHR: 150 bpm, Mod Var, +Variable Decel x 2, +Accels UCs:  Irritability Noted  Prenatal labs: ABO, Rh: --/--/O POS, O POS (09/13 0253) Antibody: NEG (09/13 0253) Rubella:  Immune RPR: Nonreactive, Nonreactive (03/14 0000)  HBsAg: Negative, Negative (03/14 0000)  HIV: Non-reactive, Non-reactive (03/14 0000)  GBS:  Negative Sickle cell/Hgb electrophoresis:  N/A Pap:  WNL 12/2015 GC:  Negative Chlamydia:  Negative Other: Hgb 14.5 at NOB, 11.2 at 28 weeks PIH w/u 04/30/16, with PCR--all WNL     Assessment IUP at 39.2wks Cat I FT Overall GHTN Headache  IOL Bishop Score: 2 Cervical Ripening Polyhydramnios LGA Infant  Plan: Admit to [facility] per consult with Dr. Kathlen Mody Routine Labor and Delivery Orders per [facility] Protocol In room to complete assessment and discuss POC: -Discussed r/b of induction including fetal distress, serial induction, pain, and increased risk of c/s delivery -Discussed induction methods including cervical ripening agents, foley bulbs, and pitocin -Patient verbalizes understanding and wishes to proceed with induction process -PreEclampsia Order Set per Protocol -Start with cytotec for cervical ripening, will reassess in 4 hours for foley appropriateness Dr.JO to be updated as appropriate   Loann Quill, MSN 07/01/2016, 9:28 PM

## 2016-07-01 NOTE — MAU Provider Note (Signed)
Chief Complaint:  Hypertension   First Provider Initiated Contact with Patient 07/01/16 2008     HPI: Christina MayoMelissa F Cervantes is a 35 y.o. [id number] at 6239w2dwho presents to maternity admissions reporting elevated blood pressure at the office today.  Has a history of preeclampsia with other pregnancy. Has headache and seeing spots for a week.  .She reports good fetal movement, denies LOF, vaginal bleeding, vaginal itching/burning, urinary symptoms, dizziness, n/v, diarrhea, constipation or fever/chills.  She denies RUQ abdominal pain.  Hypertension  This is a new problem. The current episode started today. The problem is unchanged. Associated symptoms include blurred vision (seeing spots), headaches and peripheral edema (trace). Pertinent negatives include no anxiety, malaise/fatigue or palpitations. (Headache ) There are no associated agents to hypertension. Risk factors: pregnancy, history of preeclampsia. Past treatments include nothing. There are no compliance problems.    RN Note: Pt sent from MD office for pre-eclampsia evaluation. Pt states she has had some high blood pressure, headaches and spots in her eyes since last Thursday. Pt denies epigastric pain. Pt states baby is moving normally. Pt denies bleeding, contractions, and leaking of fluid.  Past Medical History: Past Medical History:  Diagnosis Date  . Abnormal Pap smear   . Anxiety   . Headache(784.0)    otc meds prn - last one 2 wks ago  . PIH (pregnancy induced hypertension)    hx 3 yrs ag0 - no problems since delivery  . Vaginal Pap smear, abnormal     Past obstetric history: OB History  Gravida Para Term Preterm AB Living  3 2 1 1   2   SAB TAB Ectopic Multiple Live Births          2    # Outcome Date GA Lbr Len/2nd Weight Sex Delivery Anes PTL Lv  3 Current           2 Term 12/2008    F    LIV  1 Preterm 12/2001 [id number]   F    LIV      Past Surgical History: Past Surgical History:  Procedure Laterality Date  . CERVICAL  CONIZATION W/BX  12/13/2011   Procedure: CONIZATION CERVIX WITH BIOPSY;  Surgeon: Hal MoralesVanessa P Haygood, MD;  Location: [facility];  Service: Gynecology;  Laterality: N/A;  Cold Knife  . CHOLECYSTECTOMY  2004  . COLONOSCOPY    . svd     x 2  . UPPER GASTROINTESTINAL ENDOSCOPY    . WISDOM TOOTH EXTRACTION      Family History: No family history on file.  Social History: Social History  Substance Use Topics  . Smoking status: Former Smoker    Packs/day: 0.50    Years: 3.00    Types: Cigarettes  . Smokeless tobacco: Never Used     Comment: quit 2005  . Alcohol use No     Comment: social/ none during pregnacy    Allergies:  Allergies  Allergen Reactions  . Sulfa Antibiotics Rash    fever  . Amoxicillin Rash    High fever Has patient had a PCN reaction causing immediate rash, facial/tongue/throat swelling, SOB or lightheadedness with hypotension: no Has patient had a PCN reaction causing severe rash involving mucus membranes or skin necrosis: no Has patient had a PCN reaction that required hospitalization no Has patient had a PCN reaction occurring within the last 10 years: no If all of the above answers are "NO", then may proceed with Cephalosporin use.     Meds:  Prescriptions Prior to  Admission  Medication Sig Dispense Refill Last Dose  . acetaminophen (TYLENOL) 500 MG tablet Take 1,000 mg by mouth every 6 (six) hours as needed for moderate pain.    05/21/2016 at Unknown time  . calcium carbonate (TUMS - DOSED IN MG ELEMENTAL CALCIUM) 500 MG chewable tablet Chew 2 tablets by mouth 2 (two) times daily as needed for indigestion or heartburn.   05/21/2016 at Unknown time  . cyclobenzaprine (FLEXERIL) 10 MG tablet Take 1 tablet (10 mg total) by mouth every 8 (eight) hours as needed for muscle spasms. 30 tablet 0   . ferrous sulfate 325 (65 FE) MG tablet Take 325 mg by mouth daily.  1 05/21/2016 at Unknown time  . pantoprazole (PROTONIX) 40 MG tablet Take 40 mg by mouth daily.   2 Past  Week at Unknown time  . potassium chloride (K-DUR) 10 MEQ tablet Take 2 tablets (20 mEq total) by mouth 2 (two) times daily. 12 tablet 0 05/21/2016 at Unknown time  . Prenatal Vit-Fe Fumarate-FA (PRENATAL MULTIVITAMIN) TABS tablet Take 1 tablet by mouth daily at 12 noon.   05/21/2016 at Unknown time    I have reviewed patient's Past Medical Hx, Surgical Hx, Family Hx, Social Hx, medications and allergies.   ROS:  Review of Systems  Constitutional: Negative for chills, fever and malaise/fatigue.  Eyes: Positive for blurred vision (seeing spots).  Cardiovascular: Negative for palpitations.  Gastrointestinal: Negative for abdominal pain, constipation, diarrhea and nausea.  Genitourinary: Negative for pelvic pain and vaginal bleeding.  Musculoskeletal: Negative for back pain.  Neurological: Positive for headaches. Negative for dizziness, syncope, speech difficulty and weakness.   Other systems negative  Physical Exam  Patient Vitals for the past 24 hrs:  BP Temp Temp src Pulse Resp SpO2 Height Weight  07/01/16 1957 157/89 98.2 F (36.8 C) Oral 87 17 100 % 5\' 3"  (1.6 m) 219 lb (99.3 kg)   Constitutional: Well-developed, well-nourished female in no acute distress.  Cardiovascular: normal rate and rhythm Respiratory: normal effort, clear to auscultation bilaterally GI: Abd soft, non-tender, gravid appropriate for gestational age.   No rebound or guarding. MS: Extremities nontender, Trace edema, normal ROM Neurologic: Alert and oriented x 4.   DTRs 2+ with no clonus GU: Neg CVAT.  PELVIC EXAM:   Deferred   FHT:  Baseline 140 , moderate variability, accelerations present, no decelerations Contractions:  Irregular     Labs: Results for orders placed or performed during the hospital encounter of 07/01/16 (from the past 24 hour(s))  Protein / creatinine ratio, urine     Status: Abnormal   Collection Time: 07/01/16  7:59 PM  Result Value Ref Range   Creatinine, Urine 147.00 mg/dL   Total  Protein, Urine 28 mg/dL   Protein Creatinine Ratio 0.19 (H) 0.00 - 0.15 mg/mg[Cre]  CBC     Status: None   Collection Time: 07/01/16  8:11 PM  Result Value Ref Range   WBC 9.8 4.0 - 10.5 K/uL   RBC 4.01 3.87 - 5.11 MIL/uL   Hemoglobin 12.0 12.0 - 15.0 g/dL   HCT 41.3 24.4 - 01.0 %   MCV 90.8 78.0 - 100.0 fL   MCH 29.9 26.0 - 34.0 pg   MCHC 33.0 30.0 - 36.0 g/dL   RDW 27.2 53.6 - 64.4 %   Platelets 183 150 - 400 K/uL  Comprehensive metabolic panel     Status: Abnormal   Collection Time: 07/01/16  8:11 PM  Result Value Ref Range   Sodium  135 135 - 145 mmol/L   Potassium 3.6 3.5 - 5.1 mmol/L   Chloride 106 101 - 111 mmol/L   CO2 20 (L) 22 - 32 mmol/L   Glucose, Bld 104 (H) 65 - 99 mg/dL   BUN 5 (L) 6 - 20 mg/dL   Creatinine, Ser 1.61 0.44 - 1.00 mg/dL   Calcium 8.5 (L) 8.9 - 10.3 mg/dL   Total Protein 6.1 (L) 6.5 - 8.1 g/dL   Albumin 2.6 (L) 3.5 - 5.0 g/dL   AST 20 15 - 41 U/L   ALT 14 14 - 54 U/L   Alkaline Phosphatase 132 (H) 38 - 126 U/L   Total Bilirubin 0.5 0.3 - 1.2 mg/dL   GFR calc non Af Amer >60 >60 mL/min   GFR calc Af Amer >60 >60 mL/min   Anion gap 9 5 - 15    Imaging:  No results found.  MAU Course/MDM: I have ordered labs and reviewed results. Testing is within normal limits NST reviewed and found to be reactive and Category I BPs have been elevated, however, with some being in severe range.  Consult Dr Charlotta Newton with presentation, exam findings and test results.  Treatments in MAU included NST, labs.    Assessment: Single IUP at [redacted]w[redacted]d Preeclampsia Reassuring fetal heart rate tracing, Category I  Plan: Admit to [facility] for induction of labor Routine admit orders [facility] to come and admit patient Further orders to follow  Wynelle Bourgeois CNM, MSN Certified Nurse-Midwife 07/01/2016 8:14 PM

## 2016-07-01 NOTE — MAU Note (Signed)
Pt sent from MD office for pre-eclampsia evaluation. Pt states she has had some high blood pressure, headaches and spots in her eyes since last Thursday. Pt denies epigastric pain. Pt states baby is moving normally. Pt denies bleeding, contractions, and leaking of fluid.

## 2016-07-01 NOTE — MAU Note (Signed)
Pt reports she was in office today due to dizzy spells and her b/p was elevated. States they told her to come here for evaluation.

## 2016-07-02 ENCOUNTER — Inpatient Hospital Stay (HOSPITAL_COMMUNITY): Payer: Medicaid Other | Admitting: Anesthesiology

## 2016-07-02 LAB — TYPE AND SCREEN
ABO/RH(D): O POS
Antibody Screen: NEGATIVE

## 2016-07-02 LAB — CBC
HCT: 37.9 % (ref 36.0–46.0)
HEMOGLOBIN: 12.8 g/dL (ref 12.0–15.0)
MCH: 30.6 pg (ref 26.0–34.0)
MCHC: 33.8 g/dL (ref 30.0–36.0)
MCV: 90.7 fL (ref 78.0–100.0)
Platelets: 166 10*3/uL (ref 150–400)
RBC: 4.18 MIL/uL (ref 3.87–5.11)
RDW: 14.4 % (ref 11.5–15.5)
WBC: 12.3 10*3/uL — ABNORMAL HIGH (ref 4.0–10.5)

## 2016-07-02 MED ORDER — FAMOTIDINE IN NACL 20-0.9 MG/50ML-% IV SOLN
20.0000 mg | Freq: Once | INTRAVENOUS | Status: AC
  Administered 2016-07-02: 20 mg via INTRAVENOUS
  Filled 2016-07-02: qty 50

## 2016-07-02 MED ORDER — EPHEDRINE 5 MG/ML INJ: 10.0000 mg | INTRAVENOUS | Status: DC | PRN

## 2016-07-02 MED ORDER — LACTATED RINGERS IV SOLN: 500.0000 mL | Freq: Once | INTRAVENOUS | Status: DC

## 2016-07-02 MED ORDER — TERBUTALINE SULFATE 1 MG/ML IJ SOLN
0.2500 mg | Freq: Once | INTRAMUSCULAR | Status: DC | PRN
  Filled 2016-07-02: qty 1

## 2016-07-02 MED ORDER — OXYTOCIN 40 UNITS IN LACTATED RINGERS INFUSION - SIMPLE MED: 1.0000 m[IU]/min | INTRAVENOUS | Status: DC

## 2016-07-02 MED ORDER — LIDOCAINE HCL (PF) 1 % IJ SOLN
INTRAMUSCULAR | Status: DC | PRN
  Administered 2016-07-02 (×2): 8 mL via EPIDURAL

## 2016-07-02 MED ORDER — PHENYLEPHRINE 40 MCG/ML (10ML) SYRINGE FOR IV PUSH (FOR BLOOD PRESSURE SUPPORT)
80.0000 ug | PREFILLED_SYRINGE | INTRAVENOUS | Status: DC | PRN
  Filled 2016-07-02: qty 10

## 2016-07-02 MED ORDER — PHENYLEPHRINE 40 MCG/ML (10ML) SYRINGE FOR IV PUSH (FOR BLOOD PRESSURE SUPPORT): 80.0000 ug | PREFILLED_SYRINGE | INTRAVENOUS | Status: DC | PRN

## 2016-07-02 MED ORDER — DIPHENHYDRAMINE HCL 50 MG/ML IJ SOLN: 12.5000 mg | INTRAMUSCULAR | Status: DC | PRN

## 2016-07-02 MED ORDER — FENTANYL 2.5 MCG/ML BUPIVACAINE 1/10 % EPIDURAL INFUSION (WH - ANES)
14.0000 mL/h | INTRAMUSCULAR | Status: DC | PRN
  Administered 2016-07-02 – 2016-07-03 (×3): 14 mL/h via EPIDURAL
  Filled 2016-07-02 (×3): qty 125

## 2016-07-02 NOTE — Progress Notes (Signed)
Subjective: Feeling pressure, using epidural button.   Objective: BP 140/81   Pulse 100   Temp 98.1 F (36.7 C) (Oral)   Resp 18   Ht 5\' 3"  (1.6 m)   Wt 99.3 kg (219 lb)   LMP 04/06/2015   SpO2 100%   BMI 38.79 kg/m  No intake/output data recorded. No intake/output data recorded.  FHT: Category 1 UC:   Unable to monitor with external toco.  Discussed placement of IUPC, pt agreed, IUPC placed without difficulty. SVE:   Dilation: 8 Effacement (%): 80 Station: -1 Exam by:: CNM Prothero  Assessment:  Pt is a [id number] induced for gestational hypertension in active labor Cat 1 strip  Plan: Will start pitocin to obtain adequate contractions.  Kenney HousemanNancy Jean Prothero CNM, MSN 07/02/2016, 9:11 PM

## 2016-07-02 NOTE — Anesthesia Rounding Note (Deleted)
  [facility] Epidural Rounding Note  Patient: [name], 35 y.o., female  Patient's current pain level: 0  Agreed upon pain management level: 4  Epidural intervention: No   Comments:   [name] 07/02/2016

## 2016-07-02 NOTE — Anesthesia Rounding Note (Signed)
  [facility] Epidural Rounding Note  Patient: Christina MayoMelissa F [place]bank, 35 y.o., female  Patient's current pain level: 4  Agreed upon pain management level: 4  Epidural intervention: No   Comments:   Kylen Schliep 07/02/2016

## 2016-07-02 NOTE — Anesthesia Preprocedure Evaluation (Signed)
Anesthesia Evaluation  Patient identified by MRN, date of birth, ID band Patient awake    Reviewed: Allergy & Precautions, H&P , NPO status , Patient's Chart, lab work & pertinent test results  Airway Mallampati: III  TM Distance: >3 FB Neck ROM: full    Dental no notable dental hx.    Pulmonary neg pulmonary ROS, former smoker,    Pulmonary exam normal        Cardiovascular hypertension, negative cardio ROS Normal cardiovascular exam     Neuro/Psych    GI/Hepatic negative GI ROS, Neg liver ROS,   Endo/Other  negative endocrine ROSMorbid obesity  Renal/GU negative Renal ROS     Musculoskeletal   Abdominal (+) + obese,   Peds  Hematology negative hematology ROS (+)   Anesthesia Other Findings   Reproductive/Obstetrics (+) Pregnancy                             Anesthesia Physical Anesthesia Plan  ASA: II  Anesthesia Plan: Epidural   Post-op Pain Management:    Induction:   Airway Management Planned:   Additional Equipment:   Intra-op Plan:   Post-operative Plan:   Informed Consent: I have reviewed the patients History and Physical, chart, labs and discussed the procedure including the risks, benefits and alternatives for the proposed anesthesia with the patient or authorized representative who has indicated his/her understanding and acceptance.     Plan Discussed with:   Anesthesia Plan Comments:         Anesthesia Quick Evaluation

## 2016-07-02 NOTE — Anesthesia Pain Management Evaluation Note (Signed)
  [facility] Pain Management Visit Note  Patient: Christina MayoMelissa F Tun, 35 y.o., female  "Hello I am a member of the anesthesia team at [facility]. We have an anesthesia team available at all times to provide care throughout the hospital, including epidural management and anesthesia for C-section. I don't know your plan for the delivery whether it a natural birth, water birth, IV sedation, nitrous supplementation, doula or epidural, but we want to meet your pain goals."   1.Was your pain managed to your expectations on prior hospitalizations?   Yes   2.What is your expectation for pain management during this hospitalization?     Epidural  3.How can we help you reach that goal? Epidural when ready  Record the patient's initial score and the patient's pain goal.   Pain: 6  Pain Goal: 7 [facility] wants you to be able to say your pain was always managed very well.  Edison PaceWILKERSON,Ari Engelbrecht 07/02/2016

## 2016-07-02 NOTE — Progress Notes (Addendum)
Perlie MayoMelissa F Hochberg MRN: [id number]  Subjective: -Patient reports back pain, but is able to rest.  Husband at bedside.   Objective: BP (!) 146/80   Pulse 89   Temp 98.1 F (36.7 C) (Oral)   Resp 17   Ht 5\' 3"  (1.6 m)   Wt 99.3 kg (219 lb)   LMP 04/06/2015   SpO2 100%   BMI 38.79 kg/m  No intake/output data recorded. No intake/output data recorded.  Fetal Monitoring: FHT: 150 bpm, Mod Var, -Decels, +Accels UC: None graphed    Vaginal Exam: SVE:   Dilation: Closed (External os fingertip) Effacement (%): Thick Station: -3 Exam by:: Gerrit HeckJessica Issiah Huffaker, CNM Membranes:Intact Internal Monitors: None  Augmentation/Induction: Pitocin:None Cytotec: 2nd Dose given at 0220  Assessment:  IUP at 39.3wks Cat I FT  Cervical Ripening   Plan: -Will reassess in 4 hours -Continue other mgmt as ordered   Valma CavaJessica L Cristino Degroff,MSN, CNM 07/02/2016, 2:26 AM

## 2016-07-02 NOTE — Progress Notes (Signed)
Pt c/o n/v and acid reflux. Provider notified, verbal order with read back for 20mg  of Pepcid. Order verified.

## 2016-07-02 NOTE — Anesthesia Procedure Notes (Signed)
Epidural Patient location during procedure: OB Start time: 07/02/2016 11:40 AM End time: 07/02/2016 11:44 AM  Staffing Anesthesiologist: Leilani AbleHATCHETT, Jylan Loeza Performed: anesthesiologist   Preanesthetic Checklist Completed: patient identified, surgical consent, pre-op evaluation, timeout performed, IV checked, risks and benefits discussed and monitors and equipment checked  Epidural Patient position: sitting Prep: site prepped and draped and DuraPrep Patient monitoring: continuous pulse ox and blood pressure Approach: midline Location: L3-L4 Injection technique: LOR air  Needle:  Needle type: Tuohy  Needle gauge: 17 G Needle length: 9 cm and 9 Needle insertion depth: 6 cm Catheter type: closed end flexible Catheter size: 19 Gauge Catheter at skin depth: 11 cm Test dose: negative and Other  Assessment Sensory level: T9 Events: blood not aspirated, injection not painful, no injection resistance, negative IV test and no paresthesia  Additional Notes Reason for block:procedure for pain

## 2016-07-02 NOTE — Progress Notes (Signed)
Patient ID: Christina MayoMelissa F Farquharson, female   DOB: 1981/01/04, 35 y.o.   MRN: [id number]  Pt comfortable with epidural.   BP (!) 136/105   Pulse 91   Temp 97.5 F (36.4 C) (Oral)   Resp 17   Ht 5\' 3"  (1.6 m)   Wt 219 lb (99.3 kg)   LMP 04/06/2015   SpO2 100%   BMI 38.79 kg/m  Cat 1 3/90/-2 Appeared she was leaking.  AROM also and mild meconium noted Most blood pressures are stable will monitor Anticipate SVD

## 2016-07-02 NOTE — Progress Notes (Signed)
Subjective: Pt comfortable with epidural.  Having some heartburn so pepcid given Iv.  Feeling more pressure  Objective: BP (!) 145/89   Pulse (!) 108   Temp 98.4 F (36.9 C) (Axillary)   Resp 20   Ht 5\' 3"  (1.6 m)   Wt 99.3 kg (219 lb)   LMP 04/06/2015   SpO2 100%   BMI 38.79 kg/m  No intake/output data recorded. Total I/O In: -  Out: 650 [Emesis/NG output:650]  FHT: Category 1 UC:   regular, every 4-5 minutes SVE:   Rim80/-2  Pitocin off MVUs 150 mmhg  Assessment:  Active labor slow progress Cat 1 strip  Plan: Restart pitocin. Position changes.  Reevaluate at midnight  Henderson NewcomerNancy Jean Gerson Fauth CNM, MSN 07/02/2016, 11:20 PM

## 2016-07-03 ENCOUNTER — Encounter (HOSPITAL_COMMUNITY): Payer: Self-pay | Admitting: Anesthesiology

## 2016-07-03 ENCOUNTER — Encounter (HOSPITAL_COMMUNITY)
Admission: AD | Disposition: A | Discharge status: Home / Self Care | Payer: Self-pay | Source: Ambulatory Visit | Attending: Obstetrics & Gynecology

## 2016-07-03 LAB — CBC
HEMATOCRIT: 31.5 % — AB (ref 36.0–46.0)
Hemoglobin: 10.5 g/dL — ABNORMAL LOW (ref 12.0–15.0)
MCH: 30.1 pg (ref 26.0–34.0)
MCHC: 33.3 g/dL (ref 30.0–36.0)
MCV: 90.3 fL (ref 78.0–100.0)
PLATELETS: 152 10*3/uL (ref 150–400)
RBC: 3.49 MIL/uL — ABNORMAL LOW (ref 3.87–5.11)
RDW: 14.5 % (ref 11.5–15.5)
WBC: 23.5 10*3/uL — AB (ref 4.0–10.5)

## 2016-07-03 LAB — RAPID HIV SCREEN (HIV 1/2 AB+AG)
HIV 1/2 ANTIBODIES: NONREACTIVE
HIV-1 P24 Antigen - HIV24: NONREACTIVE

## 2016-07-03 LAB — RPR: RPR: NONREACTIVE

## 2016-07-03 SURGERY — Surgical Case
Anesthesia: Epidural

## 2016-07-03 MED ORDER — PHENYLEPHRINE HCL 10 MG/ML IJ SOLN
INTRAMUSCULAR | Status: DC | PRN
  Administered 2016-07-03: 40 ug via INTRAVENOUS
  Administered 2016-07-03: 20 ug via INTRAVENOUS
  Administered 2016-07-03: 40 ug via INTRAVENOUS
  Administered 2016-07-03: 20 ug via INTRAVENOUS
  Administered 2016-07-03 (×6): 40 ug via INTRAVENOUS
  Administered 2016-07-03: 80 ug via INTRAVENOUS
  Administered 2016-07-03: 40 ug via INTRAVENOUS
  Administered 2016-07-03: 80 ug via INTRAVENOUS
  Administered 2016-07-03: 40 ug via INTRAVENOUS

## 2016-07-03 MED ORDER — COCONUT OIL OIL: 1.0000 "application " | TOPICAL_OIL | Status: DC | PRN

## 2016-07-03 MED ORDER — FENTANYL CITRATE (PF) 100 MCG/2ML IJ SOLN: 25.0000 ug | INTRAMUSCULAR | Status: DC | PRN

## 2016-07-03 MED ORDER — MEPERIDINE HCL 25 MG/ML IJ SOLN: 6.2500 mg | INTRAMUSCULAR | Status: DC | PRN

## 2016-07-03 MED ORDER — PHENYLEPHRINE 40 MCG/ML (10ML) SYRINGE FOR IV PUSH (FOR BLOOD PRESSURE SUPPORT)
PREFILLED_SYRINGE | INTRAVENOUS | Status: AC
  Filled 2016-07-03: qty 10

## 2016-07-03 MED ORDER — SIMETHICONE 80 MG PO CHEW
80.0000 mg | CHEWABLE_TABLET | ORAL | Status: DC
  Administered 2016-07-04 – 2016-07-05 (×2): 80 mg via ORAL
  Filled 2016-07-03 (×2): qty 1

## 2016-07-03 MED ORDER — SCOPOLAMINE 1 MG/3DAYS TD PT72
MEDICATED_PATCH | TRANSDERMAL | Status: AC
  Filled 2016-07-03: qty 1

## 2016-07-03 MED ORDER — GENTAMICIN SULFATE 40 MG/ML IJ SOLN
Freq: Once | INTRAVENOUS | Status: AC
  Administered 2016-07-03: 115 mL via INTRAVENOUS
  Filled 2016-07-03: qty 9

## 2016-07-03 MED ORDER — NALOXONE HCL 0.4 MG/ML IJ SOLN: 0.4000 mg | INTRAMUSCULAR | Status: DC | PRN

## 2016-07-03 MED ORDER — PRENATAL MULTIVITAMIN CH
1.0000 | ORAL_TABLET | Freq: Every day | ORAL | Status: DC
  Administered 2016-07-03 – 2016-07-04 (×2): 1 via ORAL
  Filled 2016-07-03 (×2): qty 1

## 2016-07-03 MED ORDER — OXYTOCIN 10 UNIT/ML IJ SOLN
INTRAMUSCULAR | Status: AC
  Filled 2016-07-03: qty 4

## 2016-07-03 MED ORDER — MORPHINE SULFATE (PF) 0.5 MG/ML IJ SOLN
INTRAMUSCULAR | Status: AC
  Filled 2016-07-03: qty 10

## 2016-07-03 MED ORDER — NALBUPHINE HCL 10 MG/ML IJ SOLN: 5.0000 mg | INTRAMUSCULAR | Status: DC | PRN

## 2016-07-03 MED ORDER — MENTHOL 3 MG MT LOZG: 1.0000 | LOZENGE | OROMUCOSAL | Status: DC | PRN

## 2016-07-03 MED ORDER — ACETAMINOPHEN 325 MG PO TABS: 650.0000 mg | ORAL_TABLET | ORAL | Status: DC | PRN

## 2016-07-03 MED ORDER — DIPHENHYDRAMINE HCL 25 MG PO CAPS: 25.0000 mg | ORAL_CAPSULE | Freq: Four times a day (QID) | ORAL | Status: DC | PRN

## 2016-07-03 MED ORDER — NALOXONE HCL 2 MG/2ML IJ SOSY
1.0000 ug/kg/h | PREFILLED_SYRINGE | INTRAVENOUS | Status: DC | PRN
  Filled 2016-07-03: qty 2

## 2016-07-03 MED ORDER — OXYTOCIN 40 UNITS IN LACTATED RINGERS INFUSION - SIMPLE MED: 2.5000 [IU]/h | INTRAVENOUS | Status: AC

## 2016-07-03 MED ORDER — OXYCODONE-ACETAMINOPHEN 5-325 MG PO TABS: 2.0000 | ORAL_TABLET | ORAL | Status: DC | PRN

## 2016-07-03 MED ORDER — SODIUM CHLORIDE 0.9 % IR SOLN
Status: DC | PRN
  Administered 2016-07-03: 1

## 2016-07-03 MED ORDER — LACTATED RINGERS IV SOLN
INTRAVENOUS | Status: DC
  Administered 2016-07-03: 125 mL/h via INTRAVENOUS

## 2016-07-03 MED ORDER — LACTATED RINGERS IV SOLN: INTRAVENOUS | Status: DC

## 2016-07-03 MED ORDER — ONDANSETRON HCL 4 MG/2ML IJ SOLN
INTRAMUSCULAR | Status: AC
  Filled 2016-07-03: qty 2

## 2016-07-03 MED ORDER — DIPHENHYDRAMINE HCL 25 MG PO CAPS: 25.0000 mg | ORAL_CAPSULE | ORAL | Status: DC | PRN

## 2016-07-03 MED ORDER — KETOROLAC TROMETHAMINE 30 MG/ML IJ SOLN: 30.0000 mg | Freq: Four times a day (QID) | INTRAMUSCULAR | Status: AC | PRN

## 2016-07-03 MED ORDER — SIMETHICONE 80 MG PO CHEW
80.0000 mg | CHEWABLE_TABLET | Freq: Three times a day (TID) | ORAL | Status: DC
  Administered 2016-07-03 – 2016-07-05 (×6): 80 mg via ORAL
  Filled 2016-07-03 (×6): qty 1

## 2016-07-03 MED ORDER — OXYCODONE-ACETAMINOPHEN 5-325 MG PO TABS
1.0000 | ORAL_TABLET | ORAL | Status: DC | PRN
  Administered 2016-07-03 – 2016-07-05 (×6): 1 via ORAL
  Filled 2016-07-03 (×6): qty 1

## 2016-07-03 MED ORDER — DEXAMETHASONE SODIUM PHOSPHATE 4 MG/ML IJ SOLN
INTRAMUSCULAR | Status: AC
  Filled 2016-07-03: qty 1

## 2016-07-03 MED ORDER — NALBUPHINE HCL 10 MG/ML IJ SOLN: 5.0000 mg | Freq: Once | INTRAMUSCULAR | Status: DC | PRN

## 2016-07-03 MED ORDER — IBUPROFEN 600 MG PO TABS
600.0000 mg | ORAL_TABLET | Freq: Four times a day (QID) | ORAL | Status: DC
  Administered 2016-07-03 – 2016-07-05 (×9): 600 mg via ORAL
  Filled 2016-07-03 (×12): qty 1

## 2016-07-03 MED ORDER — ZOLPIDEM TARTRATE 5 MG PO TABS: 5.0000 mg | ORAL_TABLET | Freq: Every evening | ORAL | Status: DC | PRN

## 2016-07-03 MED ORDER — ONDANSETRON HCL 4 MG/2ML IJ SOLN: 4.0000 mg | Freq: Three times a day (TID) | INTRAMUSCULAR | Status: DC | PRN

## 2016-07-03 MED ORDER — PROMETHAZINE HCL 25 MG/ML IJ SOLN: 6.2500 mg | INTRAMUSCULAR | Status: DC | PRN

## 2016-07-03 MED ORDER — MORPHINE SULFATE (PF) 0.5 MG/ML IJ SOLN
INTRAMUSCULAR | Status: DC | PRN
  Administered 2016-07-03: 2 mg via INTRAVENOUS
  Administered 2016-07-03: 3 mg via EPIDURAL

## 2016-07-03 MED ORDER — SIMETHICONE 80 MG PO CHEW: 80.0000 mg | CHEWABLE_TABLET | ORAL | Status: DC | PRN

## 2016-07-03 MED ORDER — DIBUCAINE 1 % RE OINT: 1.0000 "application " | TOPICAL_OINTMENT | RECTAL | Status: DC | PRN

## 2016-07-03 MED ORDER — ONDANSETRON HCL 4 MG/2ML IJ SOLN
INTRAMUSCULAR | Status: DC | PRN
  Administered 2016-07-03: 4 mg via INTRAVENOUS

## 2016-07-03 MED ORDER — DIPHENHYDRAMINE HCL 50 MG/ML IJ SOLN: 12.5000 mg | INTRAMUSCULAR | Status: DC | PRN

## 2016-07-03 MED ORDER — SCOPOLAMINE 1 MG/3DAYS TD PT72
MEDICATED_PATCH | TRANSDERMAL | Status: DC | PRN
  Administered 2016-07-03: 1 via TRANSDERMAL

## 2016-07-03 MED ORDER — SENNOSIDES-DOCUSATE SODIUM 8.6-50 MG PO TABS
2.0000 | ORAL_TABLET | ORAL | Status: DC
  Administered 2016-07-04 – 2016-07-05 (×2): 2 via ORAL
  Filled 2016-07-03 (×2): qty 2

## 2016-07-03 MED ORDER — LIDOCAINE-EPINEPHRINE (PF) 2 %-1:200000 IJ SOLN
INTRAMUSCULAR | Status: AC
  Filled 2016-07-03: qty 20

## 2016-07-03 MED ORDER — SODIUM CHLORIDE 0.9% FLUSH: 3.0000 mL | INTRAVENOUS | Status: DC | PRN

## 2016-07-03 MED ORDER — LACTATED RINGERS IV SOLN
INTRAVENOUS | Status: DC | PRN
  Administered 2016-07-03 (×2): via INTRAVENOUS

## 2016-07-03 MED ORDER — SODIUM BICARBONATE 8.4 % IV SOLN
INTRAVENOUS | Status: DC | PRN
  Administered 2016-07-03 (×3): 5 mL via EPIDURAL
  Administered 2016-07-03: 3 mL via EPIDURAL
  Administered 2016-07-03 (×2): 2 mL via EPIDURAL

## 2016-07-03 MED ORDER — WITCH HAZEL-GLYCERIN EX PADS: 1.0000 "application " | MEDICATED_PAD | CUTANEOUS | Status: DC | PRN

## 2016-07-03 MED ORDER — SCOPOLAMINE 1 MG/3DAYS TD PT72
1.0000 | MEDICATED_PATCH | Freq: Once | TRANSDERMAL | Status: DC
  Filled 2016-07-03: qty 1

## 2016-07-03 MED ORDER — DEXAMETHASONE SODIUM PHOSPHATE 4 MG/ML IJ SOLN
INTRAMUSCULAR | Status: DC | PRN
  Administered 2016-07-03: 4 mg via INTRAVENOUS

## 2016-07-03 MED ORDER — OXYTOCIN 10 UNIT/ML IJ SOLN
INTRAMUSCULAR | Status: DC | PRN
  Administered 2016-07-03: 40 [IU] via INTRAMUSCULAR

## 2016-07-03 SURGICAL SUPPLY — 39 items
BENZOIN TINCTURE PRP APPL 2/3 (GAUZE/BANDAGES/DRESSINGS) ×3 IMPLANT
CHLORAPREP W/TINT 26ML (MISCELLANEOUS) ×3 IMPLANT
CLAMP CORD UMBIL (MISCELLANEOUS) IMPLANT
CLOSURE STERI STRIP 1/2 X4 (GAUZE/BANDAGES/DRESSINGS) ×3 IMPLANT
CLOTH BEACON ORANGE TIMEOUT ST (SAFETY) ×3 IMPLANT
DRAPE C SECTION CLR SCREEN (DRAPES) ×3 IMPLANT
DRSG OPSITE POSTOP 4X10 (GAUZE/BANDAGES/DRESSINGS) ×3 IMPLANT
ELECT REM PT RETURN 9FT ADLT (ELECTROSURGICAL) ×3
ELECTRODE REM PT RTRN 9FT ADLT (ELECTROSURGICAL) ×1 IMPLANT
EXTRACTOR VACUUM M CUP 4 TUBE (SUCTIONS) IMPLANT
EXTRACTOR VACUUM M CUP 4' TUBE (SUCTIONS)
GLOVE BIOGEL PI IND STRL 7.0 (GLOVE) ×2 IMPLANT
GLOVE BIOGEL PI INDICATOR 7.0 (GLOVE) ×4
GLOVE SURG SS PI 6.5 STRL IVOR (GLOVE) ×3 IMPLANT
GOWN STRL REUS W/TWL LRG LVL3 (GOWN DISPOSABLE) ×6 IMPLANT
KIT ABG SYR 3ML LUER SLIP (SYRINGE) IMPLANT
LIQUID BAND (GAUZE/BANDAGES/DRESSINGS) IMPLANT
NEEDLE HYPO 25X5/8 SAFETYGLIDE (NEEDLE) IMPLANT
NS IRRIG 1000ML POUR BTL (IV SOLUTION) ×3 IMPLANT
PACK C SECTION WH (CUSTOM PROCEDURE TRAY) ×3 IMPLANT
PAD ABD 7.5X8 STRL (GAUZE/BANDAGES/DRESSINGS) ×3 IMPLANT
PAD OB MATERNITY 4.3X12.25 (PERSONAL CARE ITEMS) ×3 IMPLANT
PENCIL SMOKE EVAC W/HOLSTER (ELECTROSURGICAL) ×3 IMPLANT
RTRCTR C-SECT PINK 25CM LRG (MISCELLANEOUS) ×3 IMPLANT
SPONGE DRAIN TRACH 4X4 STRL 2S (GAUZE/BANDAGES/DRESSINGS) ×6 IMPLANT
SUT CHROMIC 1 CTX 36 (SUTURE) IMPLANT
SUT CHROMIC 2 0 CT 1 (SUTURE) ×3 IMPLANT
SUT MON AB 4-0 PS1 27 (SUTURE) ×3 IMPLANT
SUT PLAIN 1 NONE 54 (SUTURE) ×3 IMPLANT
SUT PLAIN 2 0 (SUTURE) ×2
SUT PLAIN 2 0 XLH (SUTURE) ×6 IMPLANT
SUT PLAIN ABS 2-0 54XMFL TIE (SUTURE) ×1 IMPLANT
SUT PLAIN ABS 2-0 CT1 27XMFL (SUTURE) IMPLANT
SUT VIC AB 0 CTX 36 (SUTURE) ×4
SUT VIC AB 0 CTX36XBRD ANBCTRL (SUTURE) ×2 IMPLANT
SUT VIC AB 1 CTX 36 (SUTURE) ×4
SUT VIC AB 1 CTX36XBRD ANBCTRL (SUTURE) ×2 IMPLANT
TOWEL OR 17X24 6PK STRL BLUE (TOWEL DISPOSABLE) ×3 IMPLANT
TRAY FOLEY CATH SILVER 14FR (SET/KITS/TRAYS/PACK) ×3 IMPLANT

## 2016-07-03 NOTE — Progress Notes (Signed)
Received pt. From L&D in stable condition. Pain assessed, pt. Rating a 3-4 out of 10 on the pain scale and tolerating well.  Newborn at bedside.  Call light within reach.

## 2016-07-03 NOTE — Anesthesia Postprocedure Evaluation (Signed)
Anesthesia Post Note  Patient: [name]  Procedure(s) Performed: Procedure(s) (LRB): CESAREAN SECTION (N/A)  Patient location during evaluation: Mother Baby Anesthesia Type: Epidural Level of consciousness: awake and alert Pain management: pain level controlled Vital Signs Assessment: post-procedure vital signs reviewed and stable Respiratory status: spontaneous breathing and nonlabored ventilation Cardiovascular status: stable Postop Assessment: no headache, no backache, patient able to bend at knees, epidural receding, no signs of nausea or vomiting and adequate PO intake Anesthetic complications: no     Last Vitals:  Vitals:   07/03/16 0600 07/03/16 0715  BP: 128/72 131/77  Pulse: 93 82  Resp: 18 20  Temp: 36.6 C 36.7 C    Last Pain:  Vitals:   07/03/16 0715  TempSrc: Oral  PainSc: 4    Pain Goal: Patients Stated Pain Goal: 2 (07/03/16 0600)               [name]

## 2016-07-03 NOTE — Addendum Note (Signed)
Addendum  created 07/03/16 [id number] by Elgie CongoNataliya H Ansh Fauble, CRNA   Sign clinical note

## 2016-07-03 NOTE — Anesthesia Postprocedure Evaluation (Signed)
Anesthesia Post Note  Patient: [name]  Procedure(s) Performed: Procedure(s) (LRB): CESAREAN SECTION (N/A)  Patient location during evaluation: PACU Anesthesia Type: Epidural Level of consciousness: oriented and awake and alert Pain management: pain level controlled Vital Signs Assessment: post-procedure vital signs reviewed and stable Respiratory status: spontaneous breathing, respiratory function stable and patient connected to nasal cannula oxygen Cardiovascular status: blood pressure returned to baseline and stable Postop Assessment: no headache, no backache and epidural receding Anesthetic complications: no     Last Vitals:  Vitals:   07/03/16 0419 07/03/16 0420  BP:    Pulse: 88 91  Resp: 17   Temp: 36.9 C     Last Pain:  Vitals:   07/03/16 0419  TempSrc: Axillary  PainSc:    Pain Goal:                 Shelton SilvasKevin D Keaira Whitehurst

## 2016-07-03 NOTE — Interval H&P Note (Signed)
History and Physical Interval Note:  07/03/2016 1:01 AM  [name]  has presented today for surgery, with the diagnosis of fetal intolerance of labor induction, abnormal fetal heart tracing with late decelerations, arrest of dilation and desires permanent sterilization. The various methods of treatment have been discussed with the patient and family. After consideration of risks, benefits and other options for treatment, the patient has consented to  Procedure(s): CESAREAN SECTION (N/A) and bilateral tubal ligation as a surgical intervention .  The patient's history has been reviewed, patient examined, no change in status, stable for surgery.  I have reviewed the patient's chart and labs.  Questions were answered to the patient's satisfaction.     [facility] Center[facility]ULWA,[name] [name]

## 2016-07-03 NOTE — H&P (View-Only) (Signed)
Subjective: Pt feeling more pressure.  Objective: BP 140/89   Pulse (!) 129   Temp 98.4 F (36.9 C) (Axillary)   Resp 20   Ht 5' 3" (1.6 m)   Wt 99.3 kg (219 lb)   LMP 04/06/2015   SpO2 99%   BMI 38.79 kg/m  No intake/output data recorded. Total I/O In: -  Out: 650 [Emesis/NG output:650]  FHT: Category 2 UC:   regular, every 5 minutes SVE:   Dilation: Lip/rim Effacement (%): 80 Station:  ("High" stated by CNM ) Exam by:: Nancy CNM  Pitocin at 2 MVUs 150-190  Assessment:  Active labor with arrest of dilatation Cat 2 strip Plan: Discussed with pt options of continuing with current plan or proceed with LTCS with BTL.  Discussed risk and benefits of both.  Risk of infection, bleeding, injury to bowel and bladder. Pt agrees with doing LTCS.  Consent signed.  Dr. Kulwa consulted regarding poc.  Nancy Jean Prothero CNM, MSN 07/03/2016, 12:30 AM    

## 2016-07-03 NOTE — Op Note (Addendum)
Patient: [name], [name] DOB:  February 20, 1981   MRN: [id number]    DATE OF SURGERY:  07/03/2016   PREOP DIAGNOSIS:  1. 33week 4 day EGA intrauterine pregnancy 2. Induction of labor for gestational hypertension.  3.  Fetal intolerance to labor induction. 4. Abnormal fetal heart tracing with recurrent late decelerations. 5.  Arrest of dilation.  6.  Morbid obesity with BMI of 39. 7.  Desires permanent sterilization via bilateral tubal ligation.        POSTOP DIAGNOSIS: Same as above.  PROCEDURE: Primary low uterine segment transverse cesarean section via Pfannenstiel incision and bilateral tubal ligation via Modified pomeroy method.     SURGEON: Dr.  Angus Palms Sallye Ober  ASSISTANT:  Bernerd Pho, CNM  ANESTHESIA: Bolused Epidural   COMPLICATIONS: None  FINDINGS: Viable female infant in cephalic presentation, DOP position, weight pending, Apgar scores of 8 and 9. Normal uterus and fallopian tubes and ovaries bilaterally.    EBL:  1500 cc  IV FLUID:  2700 cc LR   URINE OUTPUT: 100 cc clear urine  INDICATIONS: 35 y/o P2 who presented for induction of labor for gestational hypertension.  She received cytotec, Artificial rupture of membranes and pitocin for induction.  She got to 9.5 cm and fetus was noted to have recurrent late  decelerations despite intrauterine resuscitation.  She remained at 9.5 cm without further change.  For these reasons it was decided to proceed with a primary cesarean section.  She also desired bilateral tubal ligation- partial salpingectomy.  We discussed risks, benefits and alternatives of postpartum BTL to include but not limited to risks of bleeding, infection, damage to organs and small risk of failure with an accompanying higher risk of ectopic pregnancy incase of failure.  She also understood the risk of tubal regret but she stated that she was 100% sure she did not want any more children.  She understood there were other kinds of birth control such as pills, patches, IUDs,  vaginal rings and depo provera which were temporary but she did not desire them.  She understood there was also an option of female sterilization but she did not desire that option either.  She was consented for the procedures after explaining risks benefits and alternatives of the procedures including risks of bleeding, infection and damage to organs.    PROCEDURE:   She was taken to the operating room where epidural anesthesia was administered without difficulty.  Preoperative antibiotic of clindamicin and gentamicin was administered.  She was placed in the supine position with foley catheter and her abdomen and vagina were prepped in the usual sterile fashion. Traxis abdominal pannus retractor was placed.    A Pfannenstiel incision was made with the scalpel and the incision extended through the subcutaneous layer and also the fascia with the bovie.  The fascia was nicked in the midline and then was further separated from the rectus muscles bilaterally using Mayo scissors. Kochers were placed inferiorly and then superiorly to allow further separation of fascia from the rectus muscles.  The peritoneal cavity was entered bluntly with the fingers. The Alexis retractor was placed in. The bladder flap was created using Metzenbaum scissors.   The uterus was incised with a scalpel and the incision extended bluntly bilaterally with fingers. Clear amniotic fluid was noted.  The head then the rest of the body was then delivered with abdominal pressure atraumatically.  She delivered a viable female infant.  Cord blood was collected.    The edges of the uterus was grasped  with Allis clamps and also T. Clamps. The placenta was delivered with gentle traction on the umbilical cord.  The uterus was cleared of clots and debris with a lap.  The uterus felt large and could not be exteriorized through the incision. Large sinuses were noted around the incision.  The uterine incision was closed with #1 Vicryl in a running  locked stitch. An imbricating layer of the same stitch was placed over the initial closure to enhance hemostasis.    Reinforcement of the right angle was done with 0-vicryl to control the bleeding.  Irrigation was applied and suctioned out. Excellent hemostasis was noted over the uterine incision.  A lap was placed over the incision.   The patient was tilted to her left side and the right fallopian tube was identified and followed up to the fimbria end through the incision albeit with difficulty due to body habitus challenges with exposure.  A portion of the  Fallopian tube was grasped with a Babcock clamp and free tie of 1-0 plain used to isolate this portion of tube. Another tie was placed below the first one. The mesosalpinx was transected and then the portion of the isolated tube was transected using Metzenbaums. The edges of the cut tubes were further trimmed. The Bovie was used over the tubal edges to enhance hemostasis. The tube was then returned into the abdominal cavity. The patient was then turned tilted to her right side and this allowed identification of the left fallopian tube. This was similarly followed up to the fimbria and and a portion of the tube isolated and transected as done on the right side. The  Initial tie around the tube edges fell off and therefore the individual tube ends were free ties with 3-0 plain.  Excellent hemostasis was not added.   The uterine incision was noted to be hemostatic. The muscles were then reapproximated using chromic suture in interrupted stitches.  Fascia was closed using 0 Vicryl in a running stitch. The subcutaneous layer was irrigated and suctioned out. Small perforators were contained with the bovie.  The subcutaneous was closed over using 1-0 plain in interrupted stitches. The skin was closed using 4-0 Monocryl. Steristrips, honeycomb then pressure dressing were applied. The patient was then cleaned and she was taken to the recovery room in stable  condition. The neonate was also taken to the nursery in stable condition.   SPECIMEN: Placenta to pathology, umbilical cord blood to lab  DISPOSITION: TO PACU, STABLE.    Dr. Sallye OberKulwa.   07/03/16 @ 0317am.

## 2016-07-03 NOTE — Brief Op Note (Addendum)
07/01/2016 - 07/03/2016  3:25 AM  PATIENT:  [name]  35 y.o. female  PRE-OPERATIVE DIAGNOSIS:  Arrest of dilation, Fetal intolerance of labor, abnormal fetal heart tracing  POST-OPERATIVE DIAGNOSIS:  Arrest of dilation, Fetal intolerance of labor, abnormal fetal heart tracing  PROCEDURE:  Procedure(s): CESAREAN SECTION (N/A)   And bilateral tubal ligation  SURGEON:  Surgeon(s) and Role:    * Hoover BrownsEma Bralee Feldt, MD - Primary   ASSISTANTS: CNM, Bernerd PhoNancy Prothero   ANESTHESIA:   epidural  EBL:  Total I/O In: 2700 [I.V.:2700] Out: 2450 [Urine:300; Emesis/NG output:650; Blood:1500]  BLOOD ADMINISTERED:none  DRAINS: none   LOCAL MEDICATIONS USED:  NONE  SPECIMEN:  Source of Specimen:  Placenta  DISPOSITION OF SPECIMEN:  PATHOLOGY  COUNTS:  YES  TOURNIQUET:  * No tourniquets in log *  DICTATION: .Dragon Dictation  PLAN OF CARE: Admit to inpatient   PATIENT DISPOSITION:  PACU - hemodynamically stable.   Delay start of Pharmacological VTE agent (>24hrs) due to surgical blood loss or risk of bleeding: not applicable.

## 2016-07-03 NOTE — Progress Notes (Signed)
Subjective: Pt feeling more pressure.  Objective: BP 140/89   Pulse (!) 129   Temp 98.4 F (36.9 C) (Axillary)   Resp 20   Ht 5\' 3"  (1.6 m)   Wt 99.3 kg (219 lb)   LMP 04/06/2015   SpO2 99%   BMI 38.79 kg/m  No intake/output data recorded. Total I/O In: -  Out: 650 [Emesis/NG output:650]  FHT: Category 2 UC:   regular, every 5 minutes SVE:   Dilation: Lip/rim Effacement (%): 80 Station:  ("High" stated by CNM ) Exam by:: Harriett SineNancy CNM  Pitocin at 2 MVUs 150-190  Assessment:  Active labor with arrest of dilatation Cat 2 strip Plan: Discussed with pt options of continuing with current plan or proceed with LTCS with BTL.  Discussed risk and benefits of both.  Risk of infection, bleeding, injury to bowel and bladder. Pt agrees with doing LTCS.  Consent signed.  Dr. Sallye OberKulwa consulted regarding poc.  Kenney HousemanNancy Jean Myer Bohlman CNM, MSN 07/03/2016, 12:30 AM

## 2016-07-03 NOTE — Lactation Note (Signed)
This note was copied from a baby's chart. Lactation Consultation Note  Patient Name: Girl Wille GlaserMelissa Couts VHQIO'NToday's Date: 07/03/2016    Mom attempting to feed baby [facility].  Mom had just previously fed baby 16 ml of Alimentum and 30 ml of Alimentum at 1400.   Mom was upset because she said she did not get much at all when pumping.    LC demonstrated hand expression and drops of colostrum easily flowed. Mom was laid back and baby was placed on mom's chest.  Breast were compressible but some swelling at nipple base. Baby would latch with compression but after few sucks come off again and again.  Baby then stool and voided, LC changed diaper, then baby fell asleep STS with mom in laid back position with pillows on side for safety.  LC reviewed with mom BF brochure and BF resource sheet.  BF basics reviewed with mom and mom was encouraged to call out for concerns or questions and to also call out with help latching baby.   Recommended to hold off on formula to allow the baby to get hungry in order to assess baby latching/feeding at breast.     Maryruth HancockKelly Suzanne [facility] CenterBlack 07/03/2016, 3:57 PM

## 2016-07-03 NOTE — Progress Notes (Signed)
Provider at bedside. C-section for arrest of dilation. Risks and benefits explained to patient. No questions or concerns noted. Consents signed and verified. Pt prepped for OR.

## 2016-07-03 NOTE — Transfer of Care (Addendum)
Immediate Anesthesia Transfer of Care Note  Patient: [name]  Procedure(s) Performed: Procedure(s): CESAREAN SECTION (N/A)  Patient Location: PACU  Anesthesia Type:Epidural  Level of Consciousness: awake, alert , oriented and patient cooperative  Airway & Oxygen Therapy: Patient Spontanous Breathing  Post-op Assessment: Report given to RN and Post -op Vital signs reviewed and stable  Post vital signs: Reviewed and stable  Last Vitals: TEMP 98.2 BP 134/78 HR 104 RR 17 POX 100  Pain level: 0 Last Pain: 4        Complications: No apparent anesthesia complications

## 2016-07-03 NOTE — Consult Note (Signed)
Neonatology Note:   Attendance at C-section:    I was asked by Dr. Sallye OberKulwa to attend this  C/S at term for FTP. The mother is a 35 y.o. female, [id number] at 39.2 weeks, presenting for [facility]OL [facility] to [facility], also with polyhydramnios and LGA.  GBS negative with good prenatal care. ROM 14 hours before delivery, fluid clear. Infant vigorous with good spontaneous cry and tone. Needed only minimal bulb suctioning. Ap 8/9. Lungs clear to ausc in DR. To CN to care of Pediatrician.  Dineen Kidavid C. Leary RocaEhrmann, MD

## 2016-07-04 DIAGNOSIS — O24410 Gestational diabetes mellitus in pregnancy, diet controlled: Secondary | ICD-10-CM

## 2016-07-04 LAB — CBC WITH DIFFERENTIAL/PLATELET
BASOS ABS: 0 10*3/uL (ref 0.0–0.1)
Basophils Relative: 0 %
EOS ABS: 0.2 10*3/uL (ref 0.0–0.7)
EOS PCT: 1 %
HCT: 24.8 % — ABNORMAL LOW (ref 36.0–46.0)
Hemoglobin: 7.9 g/dL — ABNORMAL LOW (ref 12.0–15.0)
LYMPHS ABS: 2.8 10*3/uL (ref 0.7–4.0)
LYMPHS PCT: 15 %
MCH: 29.7 pg (ref 26.0–34.0)
MCHC: 31.9 g/dL (ref 30.0–36.0)
MCV: 93.2 fL (ref 78.0–100.0)
MONO ABS: 1.3 10*3/uL — AB (ref 0.1–1.0)
Monocytes Relative: 7 %
Neutro Abs: 14.4 10*3/uL — ABNORMAL HIGH (ref 1.7–7.7)
Neutrophils Relative %: 77 %
PLATELETS: 145 10*3/uL — AB (ref 150–400)
RBC: 2.66 MIL/uL — AB (ref 3.87–5.11)
RDW: 14.5 % (ref 11.5–15.5)
WBC: 18.7 10*3/uL — AB (ref 4.0–10.5)

## 2016-07-04 MED ORDER — FERROUS SULFATE 325 (65 FE) MG PO TABS
325.0000 mg | ORAL_TABLET | Freq: Every day | ORAL | Status: DC
  Administered 2016-07-04 – 2016-07-05 (×2): 325 mg via ORAL
  Filled 2016-07-04 (×2): qty 1

## 2016-07-04 NOTE — Progress Notes (Signed)
[name], [name] Female, 35 y.o., 05-Jan-1981  Subjective: Postpartum Day 1: Cesarean Delivery and BTL Patient reports incisional pain, tolerating PO, + flatus and no problems voiding.    Denies any lightheadedness or dizziness with ambulation.    Objective: Vital signs in last 24 hours: Temp:  [98 Cervantes (36.7 C)-98.4 Cervantes (36.9 C)] 98.3 Cervantes (36.8 C) (10/26 0625) Pulse Rate:  [79-89] 79 (10/26 0625) Resp:  [16-20] 16 (10/26 0625) BP: (100-132)/(47-74) 100/47 (10/26 0625) SpO2:  [98 %-99 %] 98 % (10/25 1218)  Physical Exam:  General: alert, cooperative and no distress Lochia: appropriate Uterine Fundus: firm Incision: healing well, no significant drainage DVT Evaluation: No evidence of DVT seen on physical exam. Calf/Ankle edema is present, 2+ BL in legs   Recent Labs  07/02/16 1031 07/03/16 0547  HGB 12.8 10.5*  HCT 37.9 31.5*   CBC    Component Value Date/Time   WBC 18.7 (H) 07/04/2016 0905   RBC 2.66 (L) 07/04/2016 0905   HGB 7.9 (L) 07/04/2016 0905   HCT 24.8 (L) 07/04/2016 0905   PLT 145 (L) 07/04/2016 0905   MCV 93.2 07/04/2016 0905   MCH 29.7 07/04/2016 0905   MCHC 31.9 07/04/2016 0905   RDW 14.5 07/04/2016 0905   LYMPHSABS 2.8 07/04/2016 0905   MONOABS 1.3 (H) 07/04/2016 0905   EOSABS 0.2 07/04/2016 0905   BASOSABS 0.0 07/04/2016 0905     Assessment/Plan: Status post Cesarean section. Postoperative course complicated by Anemia, no symptoms from anemia Iron tabs for anemia Repeat CBC 07/05/16 Discussed intra op findings and post op follow up and incision care Out of bed and ambulation encouraged.     Ravis Herne WAKURU 07/04/2016, 8:00 AM

## 2016-07-04 NOTE — Lactation Note (Signed)
This note was copied from a baby's chart. Lactation Consultation Note  Patient Name: Christina Wille GlaserMelissa Narang ZOXWR'UToday's Date: 07/04/2016 Reason for consult: Follow-up assessment;Difficult latch Mom has been mostly bottle feeding. She reports pumping every 2 hours receiving small amount of colostrum. Offered to assist Mom with latch, nipples flat but baby could latch using breast compression. Baby would only take few suckles then becomes sleepy. Tried #20 nipple shield and baby did take more suckles at breast with nipple shield but became frustrated with no flow of milk. Mom did not want baby to be upset so chose to give bottle with formula. Discussed latching baby v/s pump/bottle feeding. Mom reports she will try to work on latch but is OK if baby does not latch as long as she can provide some breast milk. Advised Mom to continue to pump every 3 hours for 15 minutes followed by hand expression. WIC referral faxed and [facility] if [facility]. Supplemental guidelines per hours of age reviewed with and given to Mom. If Mom decides to work on latch advised to call for assist till baby latching consistently.   Maternal Data    Feeding Feeding Type: Breast Fed Length of feed: 0 min  LATCH Score/Interventions                      Lactation Tools Discussed/Used Tools: Nipple Dorris CarnesShields;Pump Nipple shield size: 20 Breast pump type: Double-Electric Breast Pump   Consult Status Consult Status: Follow-up Date: 07/05/16 Follow-up type: In-patient    Alfred LevinsGranger, Henrique Parekh Ann 07/04/2016, 3:48 PM

## 2016-07-05 LAB — CBC WITH DIFFERENTIAL/PLATELET
BASOS ABS: 0 10*3/uL (ref 0.0–0.1)
BASOS PCT: 0 %
EOS ABS: 0.3 10*3/uL (ref 0.0–0.7)
EOS PCT: 2 %
HEMATOCRIT: 23.5 % — AB (ref 36.0–46.0)
Hemoglobin: 7.6 g/dL — ABNORMAL LOW (ref 12.0–15.0)
Lymphocytes Relative: 21 %
Lymphs Abs: 3.5 10*3/uL (ref 0.7–4.0)
MCH: 30.6 pg (ref 26.0–34.0)
MCHC: 32.3 g/dL (ref 30.0–36.0)
MCV: 94.8 fL (ref 78.0–100.0)
MONO ABS: 1.3 10*3/uL — AB (ref 0.1–1.0)
Monocytes Relative: 8 %
NEUTROS ABS: 11.2 10*3/uL — AB (ref 1.7–7.7)
Neutrophils Relative %: 69 %
PLATELETS: 159 10*3/uL (ref 150–400)
RBC: 2.48 MIL/uL — ABNORMAL LOW (ref 3.87–5.11)
RDW: 14.6 % (ref 11.5–15.5)
WBC: 16.3 10*3/uL — ABNORMAL HIGH (ref 4.0–10.5)

## 2016-07-05 MED ORDER — FERROUS SULFATE 325 (65 FE) MG PO TABS: 325.0000 mg | ORAL_TABLET | Freq: Two times a day (BID) | ORAL | 1 refills | Status: DC

## 2016-07-05 MED ORDER — OXYCODONE-ACETAMINOPHEN 5-325 MG PO TABS: 1.0000 | ORAL_TABLET | ORAL | 0 refills | Status: DC | PRN

## 2016-07-05 NOTE — Progress Notes (Signed)
Discharge instructions provided with pt understanding. Cord clamp removed. Instructed pt to call before leaving so can deacitivate hugs and remove.

## 2016-07-05 NOTE — Discharge Summary (Signed)
OB Discharge Summary     Patient Name: [name] DOB: August 06, 1981 MRN: [id number]  Date of admission: 07/01/2016 Delivering MD: Hoover BrownsKULWA, EMA   Date of discharge: 07/05/2016  Admitting diagnosis: 39.2wks PIH EVAL Intrauterine pregnancy: 2363w4d     Active Diagnosis: LTCS Anxiety Obesity Gestational hypertension PPH Blood loss anemia Secondary diagnosis:   Additional problems:      Discharge diagnosis: Term Pregnancy Delivered, Gestational Hypertension and Bilateral tubal ligation                                                                                                Post partum procedures:None  Augmentation: Pitocin and Cytotec  Complications: Hemorrhage>107100mL  Hospital course:  Induction of Labor With Cesarean Section  35 y.o. yo [id number] at 3463w4d was admitted to the hospital 07/01/2016 for induction of labor. Patient had a labor course significant for slow progress with arrest of dilation. The patient went for cesarean section due to Arrest of Dilation, and delivered a Viable infant girl.  Membrane Rupture Time/Date: )11:50 AM ,07/02/2016   @Details  of operation can be found in separate operative Note.  Patient had an uncomplicated postpartum course. She is ambulating, tolerating a regular diet, passing flatus, and urinating well.  Patient is discharged home in stable condition on 07/05/16. Pt anemic due to blood loss at delivery but is asymptomatic and desires early discharge.                                Physical exam Vitals:   07/04/16 0030 07/04/16 0625 07/04/16 1814 07/05/16 0552  BP: 123/72 (!) 100/47 138/74 132/81  Pulse: 89 79 (!) 103 90  Resp: 18 16 18 20   Temp: 98.3 F (36.8 C) 98.3 F (36.8 C) 98.7 F (37.1 C) 98.4 F (36.9 C)  TempSrc: Oral Oral Oral Oral  SpO2:      Weight:      Height:       General: alert, cooperative and no distress Lochia: appropriate Uterine Fundus: firm Incision: Dressing is clean, dry, and intact DVT Evaluation:  No evidence of DVT seen on physical exam. Labs: Lab Results  Component Value Date   WBC 16.3 (H) 07/05/2016   HGB 7.6 (L) 07/05/2016   HCT 23.5 (L) 07/05/2016   MCV 94.8 07/05/2016   PLT 159 07/05/2016   CMP Latest Ref Rng & Units 07/01/2016  Glucose 65 - 99 mg/dL 308(M104(H)  BUN 6 - 20 mg/dL 5(L)  Creatinine 5.780.44 - 1.00 mg/dL 4.690.48  Sodium 629135 - 528145 mmol/L 135  Potassium 3.5 - 5.1 mmol/L 3.6  Chloride 101 - 111 mmol/L 106  CO2 22 - 32 mmol/L 20(L)  Calcium 8.9 - 10.3 mg/dL 4.1(L8.5(L)  Total Protein 6.5 - 8.1 g/dL 6.1(L)  Total Bilirubin 0.3 - 1.2 mg/dL 0.5  Alkaline Phos 38 - 126 U/L 132(H)  AST 15 - 41 U/L 20  ALT 14 - 54 U/L 14    Discharge instruction: per After Visit Summary and "Baby and Me Booklet".  After visit meds:  Medication List    TAKE these medications   acetaminophen 500 MG tablet Commonly known as:  TYLENOL Take 1,000 mg by mouth every 6 (six) hours as needed for mild pain, moderate pain or headache.   calcium carbonate 500 MG chewable tablet Commonly known as:  TUMS - dosed in mg elemental calcium Chew 2 tablets by mouth as needed for indigestion or heartburn.   ferrous sulfate 325 (65 FE) MG tablet Take 1 tablet (325 mg total) by mouth 2 (two) times daily with a meal. What changed:  when to take this   oxyCODONE-acetaminophen 5-325 MG tablet Commonly known as:  PERCOCET/ROXICET Take 1 tablet by mouth every 4 (four) hours as needed (pain scale 4-7).   pantoprazole 40 MG tablet Commonly known as:  PROTONIX Take 40 mg by mouth daily.   prenatal multivitamin Tabs tablet Take 1 tablet by mouth daily.       Diet: routine diet  Activity: Advance as tolerated. Pelvic rest for 6 weeks.   Outpatient follow up:2 weeks Follow up Appt:Future Appointments Date Time Provider Department Center  07/06/2016 12:00 AM [facility] ROOM [facility] None   Follow up Visit:No Follow-up on file.  Postpartum contraception: Tubal Ligation  Newborn Data: Live  born female  Birth Weight: 8 lb 14.3 oz (4035 g) APGAR: 8, 9  Baby Feeding: Breast Disposition:home with mother   07/05/2016 [name], CNM

## 2016-07-05 NOTE — Lactation Note (Signed)
This note was copied from a baby's chart. Lactation Consultation Note  Patient Name: [name] XBJYN'WToday's Date: 07/05/2016 Reason for consult: Follow-up assessment;Infant weight loss;Other (Comment) (2% weight loss. per mom baby recently breast fed for few minutes and supplemented afterwards )  Per mom my breast milk is in and I was really full earlier and the nurse helped with icing and pumping and I got relief. LC assessed breast tissue with moms permission and breast  Felt slightly full, no engorgement noted. Sore nipples and engorgement prevention and tx reviewed. Per mom family is buying her a DEBP today. Mom did not know the make. Mom also mentioned she had not been using a NS , baby didn't like it.  LC recommended to mom since her milk volume is increasing the baby probably may be more patient with latching, but since the baby has been supplemented often so far, she may  Find giving a appetizer of EBM or formula from a the bottle 1st, and then back to the breast, may work.  LC offered to make an LC O/P appt today for next week and mom requested to wait and she would work on the breast feeding and call back for appt.  LC stressed the importance of breast stimulation with either the baby feeding at the breast or pumping both breast 15 -20 mins to  establish and protect milk supply. Mother informed of post-discharge support and given phone number to the lactation department, including services for phone call assistance; out-patient appointments; and breastfeeding support group. List of other breastfeeding resources in the community given in the handout. Encouraged mother to call for problems or concerns related to breastfeeding.    Maternal Data    Feeding Feeding Type: Breast Fed (refused breast) Nipple Type: Slow - flow Length of feed: 0 min  LATCH Score/Interventions Latch: Repeated attempts needed to sustain latch, nipple held in mouth throughout feeding, stimulation needed to  elicit sucking reflex. Intervention(s): Assist with latch;Adjust position;Breast massage;Breast compression  Audible Swallowing: None Intervention(s): Skin to skin;Hand expression  Type of Nipple: Everted at rest and after stimulation  Comfort (Breast/Nipple): Soft / non-tender     Hold (Positioning): No assistance needed to correctly position infant at breast. Intervention(s): Breastfeeding basics reviewed  LATCH Score: 7  Lactation Tools Discussed/Used Breast pump type: Double-Electric Breast Pump   Consult Status Consult Status: Complete (LC offered mom and LC O/P appt. and per mom requested to go home work on the breast feeding and call back ) Date: 07/05/16    [name] 07/05/2016, 10:49 AM

## 2016-07-05 NOTE — Progress Notes (Signed)
MOB was referred for history of depression/anxiety. * Referral screened out by Clinical Social Worker because none of the following criteria appear to apply: ~ History of anxiety/depression during this pregnancy, or of post-partum depression. ~ Diagnosis of anxiety and/or depression within last 3 years OR * MOB's symptoms currently being treated with medication and/or therapy. Please contact the Clinical Social Worker if needs arise, or if MOB requests.  Chart notes dx in 2013.  Has been treated with medication in the past.  

## 2016-07-06 ENCOUNTER — Inpatient Hospital Stay (HOSPITAL_COMMUNITY): Admission: RE | Admit: 2016-07-06 | Payer: Medicaid Other | Source: Ambulatory Visit

## 2017-05-27 ENCOUNTER — Ambulatory Visit: Payer: Self-pay | Admitting: Obstetrics

## 2017-06-20 ENCOUNTER — Encounter (HOSPITAL_COMMUNITY): Payer: Self-pay | Admitting: Emergency Medicine

## 2017-06-20 DIAGNOSIS — Z5321 Procedure and treatment not carried out due to patient leaving prior to being seen by health care provider: Secondary | ICD-10-CM | POA: Insufficient documentation

## 2017-06-20 DIAGNOSIS — L509 Urticaria, unspecified: Secondary | ICD-10-CM | POA: Insufficient documentation

## 2017-06-20 NOTE — ED Triage Notes (Addendum)
Pt reports hives that developed three days ago.  Despite several OTC medications, hives have continued which is causing itching to the point she is having trouble sleeping. Pt denies trouble breathing or throat issues, is speaking and breathing normally.  Pt reports history of anxiety, which will cause her bp and heart rate to go up.

## 2017-06-21 ENCOUNTER — Emergency Department (HOSPITAL_COMMUNITY)
Admission: EM | Admit: 2017-06-21 | Discharge: 2017-06-21 | Disposition: A | Discharge status: Home / Self Care | Payer: Medicaid Other | Source: Home / Self Care | Attending: Emergency Medicine | Admitting: Emergency Medicine

## 2017-06-21 ENCOUNTER — Emergency Department (HOSPITAL_COMMUNITY)
Admission: EM | Admit: 2017-06-21 | Discharge: 2017-06-21 | Disposition: A | Discharge status: Home / Self Care | Payer: Medicaid Other | Attending: Emergency Medicine | Admitting: Emergency Medicine

## 2017-06-21 ENCOUNTER — Encounter (HOSPITAL_COMMUNITY): Payer: Self-pay | Admitting: Emergency Medicine

## 2017-06-21 DIAGNOSIS — L509 Urticaria, unspecified: Secondary | ICD-10-CM | POA: Diagnosis present

## 2017-06-21 DIAGNOSIS — R21 Rash and other nonspecific skin eruption: Secondary | ICD-10-CM

## 2017-06-21 DIAGNOSIS — Z5321 Procedure and treatment not carried out due to patient leaving prior to being seen by health care provider: Secondary | ICD-10-CM | POA: Diagnosis not present

## 2017-06-21 MED ORDER — PREDNISONE 50 MG PO TABS: ORAL_TABLET | ORAL | 0 refills | Status: DC

## 2017-06-21 MED ORDER — HYDROXYZINE HCL 25 MG PO TABS: 25.0000 mg | ORAL_TABLET | Freq: Four times a day (QID) | ORAL | 0 refills | Status: DC

## 2017-06-21 MED ORDER — RANITIDINE HCL 150 MG PO TABS: 150.0000 mg | ORAL_TABLET | Freq: Two times a day (BID) | ORAL | 0 refills | Status: DC

## 2017-06-21 MED ORDER — FAMOTIDINE 20 MG PO TABS
20.0000 mg | ORAL_TABLET | Freq: Once | ORAL | Status: AC
  Administered 2017-06-21: 20 mg via ORAL
  Filled 2017-06-21: qty 1

## 2017-06-21 MED ORDER — HYDROXYZINE HCL 25 MG PO TABS
25.0000 mg | ORAL_TABLET | Freq: Once | ORAL | Status: AC
  Administered 2017-06-21: 25 mg via ORAL
  Filled 2017-06-21: qty 1

## 2017-06-21 MED ORDER — PREDNISONE 20 MG PO TABS
60.0000 mg | ORAL_TABLET | Freq: Once | ORAL | Status: AC
  Administered 2017-06-21: 60 mg via ORAL
  Filled 2017-06-21: qty 3

## 2017-06-21 NOTE — ED Provider Notes (Signed)
[facility]-EMERGENCY DEPT Provider Note   CSN: [id number] Arrival date & time: 06/21/17  1548     History   Chief Complaint Chief Complaint  Patient presents with  . Rash    HPI [name] is a 36 y.o. female who presents to the ED with a rash. Patient reports that she has been taking Claritin for the past 2 years it seemed not to help as much so she switched to Singular and after the first pill she broke out in a rash. She wasn't sure what the rash was coming from so she continued to take the Singular and the rash got worse. She has taken the Singular the past 4 days including today. The rash started on her chest and then spread to the neck, face, arms and now all over. She complains of severe itching. She has used Benadryl, cool compresses and lotions without relief.   The history is provided by the patient. No language interpreter was used.  Rash   This is a new problem. The current episode started more than 2 days ago. The problem has been gradually worsening. The problem is associated with new medications. There has been no fever.    Past Medical History:  Diagnosis Date  . Abnormal Pap smear   . Anxiety   . Headache(784.0)    otc meds prn - last one 2 wks ago  . Pregnancy induced hypertension   . Preterm labor   . Vaginal Pap smear, abnormal     Patient Active Problem List   Diagnosis Date Noted  . PPH (postpartum hemorrhage) 07/05/2016  . Delivery of third pregnancy by cesarean section using transverse incision of lower segment of uterus 07/03/2016  . Gestational hypertension w/o significant proteinuria in 3rd trimester 07/01/2016  . Allergy history, sulfonamide 12/28/2015  . Allergy to amoxicillin 12/28/2015  . Obesity (BMI 30-39.9) 12/28/2015  . Pregnancy complicated by prior cervical conization, antepartum (with biopsy) 12/28/2015  . Anxiety 12/13/2011    Past Surgical History:  Procedure Laterality Date  . CERVICAL CONIZATION W/BX  12/13/2011   Procedure:  CONIZATION CERVIX WITH BIOPSY;  Surgeon: Hal Morales, MD;  Location: [facility];  Service: Gynecology;  Laterality: N/A;  Cold Knife  . CESAREAN SECTION N/A 07/03/2016   Procedure: CESAREAN SECTION;  Surgeon: Hoover Browns, MD;  Location: [facility];  Service: Obstetrics;  Laterality: N/A;  . CHOLECYSTECTOMY  2004  . COLONOSCOPY    . svd     x 2  . UPPER GASTROINTESTINAL ENDOSCOPY    . WISDOM TOOTH EXTRACTION      OB History    Gravida Para Term Preterm AB Living   SAB TAB Ectopic Multiple Live Births         0 3       Home Medications    Prior to Admission medications   Medication Sig Start Date End Date Taking? Authorizing Provider  acetaminophen (TYLENOL) 500 MG tablet Take 1,000 mg by mouth every 6 (six) hours as needed for mild pain, moderate pain or headache.     [provider]  calcium carbonate (TUMS - DOSED IN MG ELEMENTAL CALCIUM) 500 MG chewable tablet Chew 2 tablets by mouth as needed for indigestion or heartburn.     [provider]  ferrous sulfate 325 (65 FE) MG tablet Take 1 tablet (325 mg total) by mouth 2 (two) times daily with a meal. 07/05/16   Prothero, Henderson Newcomer,  CNM  hydrOXYzine (ATARAX/VISTARIL) 25 MG tablet Take 1 tablet (25 mg total) by mouth every 6 (six) hours. 06/21/17   Janne Napoleon, NP  oxyCODONE-acetaminophen (PERCOCET/ROXICET) 5-325 MG tablet Take 1 tablet by mouth every 4 (four) hours as needed (pain scale 4-7). 07/05/16   Prothero, Henderson Newcomer, CNM  pantoprazole (PROTONIX) 40 MG tablet Take 40 mg by mouth daily.     [provider]  predniSONE (DELTASONE) 50 MG tablet Starting tomorrow 06/22/17 take one tablet PO daily 06/21/17   Janne Napoleon, NP  Prenatal Vit-Fe Fumarate-FA (PRENATAL MULTIVITAMIN) TABS tablet Take 1 tablet by mouth daily.     [provider]  ranitidine (ZANTAC) 150 MG tablet Take 1 tablet (150 mg total) by mouth 2 (two) times daily. 06/21/17   Janne Napoleon, NP    Family  History Family History  Problem Relation Age of Onset  . Diabetes Maternal Aunt   . Diabetes Maternal Grandfather   . Hypertension Maternal Grandfather     Social History Social History  Substance Use Topics  . Smoking status: Former Smoker    Packs/day: 0.50    Years: 3.00    Types: Cigarettes  . Smokeless tobacco: Never Used     Comment: quit 2005  . Alcohol use No     Comment: social/ none during pregnacy     Allergies   Sulfa antibiotics and Amoxicillin   Review of Systems Review of Systems  Constitutional: Negative for chills and fever.  Skin: Positive for rash.       Itching  All other systems reviewed and are negative.    Physical Exam Updated Vital Signs BP (!) 165/107 (BP Location: Right Arm)   Pulse 90   Temp 98.1 F (36.7 C) (Oral)   Resp 18   LMP 06/11/2017 (Exact Date)   SpO2 98%   Physical Exam  Constitutional: She is oriented to person, place, and time. She appears well-developed and well-nourished. No distress.  HENT:  Mouth/Throat: Uvula is midline, oropharynx is clear and moist and mucous membranes are normal.  Eyes: EOM are normal.  Neck: Neck supple.  Cardiovascular: Normal rate and regular rhythm.   Pulmonary/Chest: Effort normal and breath sounds normal.  Abdominal: Soft. There is no tenderness.  Musculoskeletal: Normal range of motion.  Neurological: She is alert and oriented to person, place, and time. No cranial nerve deficit.  Skin: Rash noted.  Rash to face, arms, neck and lower legs. No swelling, no drainage, no red streaking.  Psychiatric: She has a normal mood and affect. Her behavior is normal. Thought content normal.  Nursing note and vitals reviewed.    ED Treatments / Results  Labs (all labs ordered are listed, but only abnormal results are displayed) Labs Reviewed - No data to display   Radiology No results found.  Procedures Procedures (including critical care time)  Medications Ordered in ED Medications    famotidine (PEPCID) tablet 20 mg (20 mg Oral Given 06/21/17 1748)  hydrOXYzine (ATARAX/VISTARIL) tablet 25 mg (25 mg Oral Given 06/21/17 1748)  predniSONE (DELTASONE) tablet 60 mg (60 mg Oral Given 06/21/17 1748)     Initial Impression / Assessment and Plan / ED Course  I have reviewed the triage vital signs and the nursing notes. 36 y.o. female with rash and itching that is possible reaction to medication or contact dermatitis. Patient improved within 20 minutes after treatment with medications in the ED. Will d/c home with return precautions and medications.   Final Clinical  Impressions(s) / ED Diagnoses   Final diagnoses:  Rash    New Prescriptions Discharge Medication List as of 06/21/2017  6:37 PM    START taking these medications   Details  hydrOXYzine (ATARAX/VISTARIL) 25 MG tablet Take 1 tablet (25 mg total) by mouth every 6 (six) hours., Starting Sat 06/21/2017, Print    predniSONE (DELTASONE) 50 MG tablet Starting tomorrow 06/22/17 take one tablet PO daily, Print    ranitidine (ZANTAC) 150 MG tablet Take 1 tablet (150 mg total) by mouth 2 (two) times daily., Starting Sat 06/21/2017, Print         [place], [place], [place] 06/21/17 2316    Vanetta Mulders, MD 06/22/17 0001

## 2017-06-21 NOTE — Discharge Instructions (Signed)
You may have an allergic reaction to the new medication you started or you may have come in contact with something you are allergic to. Stop the new medication and go back to Claritin and take the medications we give you. Follow up with your doctor or return here for worsening symptoms.

## 2017-06-21 NOTE — ED Triage Notes (Addendum)
Patient reports rash came up on Thursday and taking Benadryl and other OTC medications that arent helping with the itching. Patient denies any new foods, soaps,  Detergents.

## 2017-06-21 NOTE — ED Notes (Signed)
Pt not present in waiting area, will attempt to call again in a few minutes.

## 2017-06-21 NOTE — ED Notes (Signed)
Pt had taken Claritin for allergies for 2 years however switched to Singulair on 06/18/17 and then has broken out in a rash that is not improved with OTC benadryl.

## 2017-06-21 NOTE — ED Notes (Signed)
pts name called for a room no answer 

## 2017-11-20 ENCOUNTER — Ambulatory Visit: Payer: Medicaid Other | Admitting: Family Medicine

## 2020-01-05 ENCOUNTER — Encounter (HOSPITAL_COMMUNITY): Payer: Self-pay

## 2020-01-05 ENCOUNTER — Other Ambulatory Visit: Payer: Self-pay

## 2020-01-05 ENCOUNTER — Ambulatory Visit (HOSPITAL_COMMUNITY)
Admission: EM | Admit: 2020-01-05 | Discharge: 2020-01-05 | Disposition: A | Discharge status: Home / Self Care | Payer: Medicaid Other | Attending: Family Medicine | Admitting: Family Medicine

## 2020-01-05 DIAGNOSIS — L243 Irritant contact dermatitis due to cosmetics: Secondary | ICD-10-CM | POA: Diagnosis not present

## 2020-01-05 DIAGNOSIS — R030 Elevated blood-pressure reading, without diagnosis of hypertension: Secondary | ICD-10-CM

## 2020-01-05 MED ORDER — PREDNISONE 10 MG (21) PO TBPK: ORAL_TABLET | Freq: Every day | ORAL | 0 refills | Status: DC

## 2020-01-05 NOTE — ED Provider Notes (Signed)
[facility]   [id number] 01/05/20 Arrival Time: 1131  ASSESSMENT & PLAN:  1. Irritant contact dermatitis due to cosmetics   2. Elevated blood pressure reading without diagnosis of hypertension      Begin: Meds ordered this encounter  Medications  . predniSONE (STERAPRED UNI-PAK 21 TAB) 10 MG (21) TBPK tablet    Sig: Take by mouth daily. Take as directed.    Dispense:  21 tablet    Refill:  0   Benadryl as needed.   Follow-up Information    [facility].   Specialty: Urgent Care Why: If worsening or failing to improve as anticipated. Contact information: [address] [place] [address] [phone]           Discharge Instructions     Your blood pressure was noted to be elevated during your visit today. If you are currently taking medication for high blood pressure, please ensure you are taking this as directed. If you do not have a history of high blood pressure and your blood pressure remains persistently elevated, you may need to begin taking a medication at some point. You may return here within the next few days to recheck if unable to see your primary care provider or if do not have a one.  BP (!) 167/103 (BP Location: Right Arm)   Pulse 91   Temp 98.2 F (36.8 C) (Oral)   Resp 18   LMP 12/12/2019   SpO2 94%   Breastfeeding No        Reviewed expectations re: course of current medical issues. Questions answered. Outlined signs and symptoms indicating need for more acute intervention. Understanding verbalized. After Visit Summary given.   SUBJECTIVE: History from: patient. [name] is a 39 y.o. female who questions allergic rxn; face; red and itchy after using new makeup remover wipe. Noted last evening. Significant itching. No pain. Benadryl with not much help. No resp or swallowing difficulties.  Increased blood pressure noted today. Reports that she has been treated for  hypertension in the past. Only during pregnancy.  She reports no chest pain on exertion, no dyspnea on exertion, no swelling of ankles, no orthostatic dizziness or lightheadedness, no orthopnea or paroxysmal nocturnal dyspnea, no palpitations and no intermittent claudication symptoms. OBJECTIVE:  Vitals:   01/05/20 1221  BP: (!) 167/103  Pulse: 91  Resp: 18  Temp: 98.2 F (36.8 C)  TempSrc: Oral  SpO2: 94%    General appearance: alert; no distress Eyes: PERRLA; EOMI; conjunctiva normal HENT: [place]; AT; nasal mucosa normal; oral mucosa normal Neck: supple  Lungs: speaks full sentences without difficulty; unlabored Extremities: no edema Skin: warm and dry; facial inflammation and swelling consistent with contact dermatitis Neurologic: normal gait Psychological: alert and cooperative; normal mood and affect    Allergies  Allergen Reactions  . Sulfa Antibiotics Rash and Other (See Comments)    Reaction:  Fever   . Amoxicillin Rash and Other (See Comments)    Reaction:  Fever  Has patient had a PCN reaction causing immediate rash, facial/tongue/throat swelling, SOB or lightheadedness with hypotension: No Has patient had a PCN reaction causing severe rash involving mucus membranes or skin necrosis: No Has patient had a PCN reaction that required hospitalization No Has patient had a PCN reaction occurring within the last 10 years: No If all of the above answers are "NO", then may proceed with Cephalosporin use.     Past Medical History:  Diagnosis Date  .  Abnormal Pap smear   . Anxiety   . Headache(784.0)    otc meds prn - last one 2 wks ago  . Pregnancy induced hypertension   . Preterm labor   . Vaginal Pap smear, abnormal    Social History   Socioeconomic History  . Marital status: Single    Spouse name: Not on file  . Number of children: Not on file  . Years of education: Not on file  . Highest education level: Not on file  Occupational History  . Not on file    Tobacco Use  . Smoking status: Former Smoker    Packs/day: 0.50    Years: 3.00    Pack years: 1.50    Types: Cigarettes  . Smokeless tobacco: Never Used  . Tobacco comment: quit 2005  Substance and Sexual Activity  . Alcohol use: No    Comment: social/ none during pregnacy  . Drug use: No  . Sexual activity: Yes    Birth control/protection: Surgical  Other Topics Concern  . Not on file  Social History Narrative  . Not on file   Social Determinants of Health   Financial Resource Strain:   . Difficulty of Paying Living Expenses:   Food Insecurity:   . Worried About Charity fundraiser in the Last Year:   . Arboriculturist in the Last Year:   Transportation Needs:   . Film/video editor (Medical):   Marland Kitchen Lack of Transportation (Non-Medical):   Physical Activity:   . Days of Exercise per Week:   . Minutes of Exercise per Session:   Stress:   . Feeling of Stress :   Social Connections:   . Frequency of Communication with Friends and Family:   . Frequency of Social Gatherings with Friends and Family:   . Attends Religious Services:   . Active Member of Clubs or Organizations:   . Attends Archivist Meetings:   Marland Kitchen Marital Status:   Intimate Partner Violence:   . Fear of Current or Ex-Partner:   . Emotionally Abused:   Marland Kitchen Physically Abused:   . Sexually Abused:    Family History  Problem Relation Age of Onset  . Diabetes Maternal Aunt   . Diabetes Maternal Grandfather   . Hypertension Maternal Grandfather   . Diabetes Mother   . Healthy Father    Past Surgical History:  Procedure Laterality Date  . CERVICAL CONIZATION W/BX  12/13/2011   Procedure: CONIZATION CERVIX WITH BIOPSY;  Surgeon: Eldred Manges, MD;  Location: [facility];  Service: Gynecology;  Laterality: N/A;  Cold Knife  . CESAREAN SECTION N/A 07/03/2016   Procedure: CESAREAN SECTION;  Surgeon: Waymon Amato, MD;  Location: [facility];  Service: Obstetrics;  Laterality: N/A;  .  CHOLECYSTECTOMY  2004  . COLONOSCOPY    . svd     x 2  . UPPER GASTROINTESTINAL ENDOSCOPY    . WISDOM TOOTH EXTRACTION       Vanessa Kick, MD 01/05/20 1253

## 2020-01-05 NOTE — ED Triage Notes (Signed)
Pt is here with a facial rash that started last night around 8pm, she thought it could be allergies she has a hx of them. Pt has taken Benadryl to relieve discomfort.

## 2020-01-05 NOTE — Discharge Instructions (Signed)
Your blood pressure was noted to be elevated during your visit today. If you are currently taking medication for high blood pressure, please ensure you are taking this as directed. If you do not have a history of high blood pressure and your blood pressure remains persistently elevated, you may need to begin taking a medication at some point. You may return here within the next few days to recheck if unable to see your primary care provider or if do not have a one.  BP (!) 167/103 (BP Location: Right Arm)   Pulse 91   Temp 98.2 F (36.8 C) (Oral)   Resp 18   LMP 12/12/2019   SpO2 94%   Breastfeeding No

## 2020-02-24 ENCOUNTER — Telehealth: Payer: Self-pay

## 2020-02-24 NOTE — Telephone Encounter (Signed)
Please advise 

## 2020-02-24 NOTE — Telephone Encounter (Signed)
Fine except that someone in front would need to check what type of medicaid patient has as we are not in network provider for PCP in some medicaid plans.

## 2020-02-24 NOTE — Telephone Encounter (Signed)
Spoke with patient about her insurance. She dose have medicaid and we do not accept it at this time. Patient understood.

## 2020-02-24 NOTE — Telephone Encounter (Signed)
New message     The patient is asking to see Dr. Okey Dupre would take her on as a new patient   Her mom's name is Kimberly Nieland was told when she was in the office last that Dr. Okey Dupre would accept her and her daughter whose 41 as a new patient.    Please advise

## 2020-10-21 ENCOUNTER — Encounter (HOSPITAL_COMMUNITY): Payer: Self-pay

## 2020-10-21 ENCOUNTER — Emergency Department (HOSPITAL_COMMUNITY)
Admission: EM | Admit: 2020-10-21 | Discharge: 2020-10-21 | Disposition: A | Discharge status: Home / Self Care | Payer: Medicaid Other | Attending: Emergency Medicine | Admitting: Emergency Medicine

## 2020-10-21 ENCOUNTER — Other Ambulatory Visit: Payer: Self-pay

## 2020-10-21 DIAGNOSIS — Z87891 Personal history of nicotine dependence: Secondary | ICD-10-CM | POA: Insufficient documentation

## 2020-10-21 DIAGNOSIS — H02849 Edema of unspecified eye, unspecified eyelid: Secondary | ICD-10-CM | POA: Insufficient documentation

## 2020-10-21 DIAGNOSIS — T781XXA Other adverse food reactions, not elsewhere classified, initial encounter: Secondary | ICD-10-CM | POA: Diagnosis not present

## 2020-10-21 DIAGNOSIS — T7840XA Allergy, unspecified, initial encounter: Secondary | ICD-10-CM

## 2020-10-21 MED ORDER — SODIUM CHLORIDE 0.9 % IV BOLUS
1000.0000 mL | Freq: Once | INTRAVENOUS | Status: AC
  Administered 2020-10-21: 1000 mL via INTRAVENOUS

## 2020-10-21 MED ORDER — METHYLPREDNISOLONE SODIUM SUCC 125 MG IJ SOLR
125.0000 mg | Freq: Once | INTRAMUSCULAR | Status: AC
  Administered 2020-10-21: 125 mg via INTRAVENOUS
  Filled 2020-10-21: qty 2

## 2020-10-21 MED ORDER — TRIAMCINOLONE ACETONIDE 0.1 % EX CREA: 1.0000 "application " | TOPICAL_CREAM | Freq: Two times a day (BID) | CUTANEOUS | 0 refills | Status: DC

## 2020-10-21 MED ORDER — EPINEPHRINE 0.3 MG/0.3ML IJ SOAJ: 0.3000 mg | Freq: Once | INTRAMUSCULAR | 1 refills | Status: DC | PRN

## 2020-10-21 MED ORDER — FAMOTIDINE 20 MG PO TABS: 20.0000 mg | ORAL_TABLET | Freq: Two times a day (BID) | ORAL | 0 refills | Status: DC

## 2020-10-21 MED ORDER — FAMOTIDINE IN NACL 20-0.9 MG/50ML-% IV SOLN
20.0000 mg | INTRAVENOUS | Status: AC
  Administered 2020-10-21: 20 mg via INTRAVENOUS
  Filled 2020-10-21: qty 50

## 2020-10-21 MED ORDER — DIPHENHYDRAMINE HCL 50 MG/ML IJ SOLN
25.0000 mg | Freq: Once | INTRAMUSCULAR | Status: AC
  Administered 2020-10-21: 25 mg via INTRAVENOUS
  Filled 2020-10-21: qty 1

## 2020-10-21 NOTE — Discharge Instructions (Signed)
Take the prescribed medication as directed.  Careful with the cream not to get into your eyes, use small amounts to prevent skin thinning/discoloration.  Can continue Benadryl as needed for itching. Prescription for EpiPen sent to pharmacy as well, if you experience any lip or tongue swelling, difficulty swallowing, or trouble breathing go ahead and use this.  If used, you do need to come to the ER for evaluation and monitoring. Follow-up with your primary care doctor. Return to the ED for new or worsening symptoms.

## 2020-10-21 NOTE — ED Triage Notes (Addendum)
Patient had sushi Wednesday night at 8pm. First time trying the wasabi. Thursday woke up and had some swelling in her face and it has gotten worse. Patient woke up tonight looked in the mirror and saw her eyes cracking. Patient took Benadryl at 930 last night and Ibuprofen at 10pm without relief. Patient denies shortness of breath and feels itchy. Her lips feel itchy too. Throat feels fine.

## 2020-10-21 NOTE — ED Provider Notes (Signed)
[facility]-EMERGENCY DEPT Provider Note   CSN: [id number] Arrival date & time: 10/21/20  4098     History Chief Complaint  Patient presents with  . Allergic Reaction    [name] is a 40 y.o. female.  The history is provided by the patient and medical records.  Allergic Reaction   40 year old female with history of anxiety, presenting to the ED with allergic reaction.  States Wednesday night she went out and had sushi with friends which she is eaten multiple times in the past.  States this time she did try somewhat wasabi sauce which she has never eaten before.  States Thursday she had some mild puffiness around the eyes and tried Benadryl without much change.  Went to bed last evening and awoke in the middle the night with significantly increased swelling around the eyes.  States now her skin feels like it is "cracking" from the edema.  She denies any difficulty swallowing, sensation of lip or tongue swelling, shortness of breath.  She denies any changes in soaps or detergents.  No new make-up or other cosmetics.  She has not tried any other new foods that she is aware of.  Her only known allergies are to sulfa and penicillin antibiotics.  Last dose of Benadryl around 9:30 PM last evening.  Past Medical History:  Diagnosis Date  . Abnormal Pap smear   . Anxiety   . Headache(784.0)    otc meds prn - last one 2 wks ago  . Pregnancy induced hypertension   . Preterm labor   . Vaginal Pap smear, abnormal     Patient Active Problem List   Diagnosis Date Noted  . PPH (postpartum hemorrhage) 07/05/2016  . Delivery of third pregnancy by cesarean section using transverse incision of lower segment of uterus 07/03/2016  . Gestational hypertension w/o significant proteinuria in 3rd trimester 07/01/2016  . Allergy history, sulfonamide 12/28/2015  . Allergy to amoxicillin 12/28/2015  . Obesity (BMI 30-39.9) 12/28/2015  . Pregnancy complicated by prior cervical  conization, antepartum (with biopsy) 12/28/2015  . Anxiety 12/13/2011    Past Surgical History:  Procedure Laterality Date  . CERVICAL CONIZATION W/BX  12/13/2011   Procedure: CONIZATION CERVIX WITH BIOPSY;  Surgeon: Hal Morales, MD;  Location: [facility];  Service: Gynecology;  Laterality: N/A;  Cold Knife  . CESAREAN SECTION N/A 07/03/2016   Procedure: CESAREAN SECTION;  Surgeon: Hoover Browns, MD;  Location: [facility];  Service: Obstetrics;  Laterality: N/A;  . CHOLECYSTECTOMY  2004  . COLONOSCOPY    . svd     x 2  . UPPER GASTROINTESTINAL ENDOSCOPY    . WISDOM TOOTH EXTRACTION       OB History    Gravida  3   Para  3   Term  2   Preterm  1   AB      Living  3     SAB      IAB      Ectopic      Multiple  0   Live Births  3           Family History  Problem Relation Age of Onset  . Diabetes Maternal Aunt   . Diabetes Maternal Grandfather   . Hypertension Maternal Grandfather   . Diabetes Mother   . Healthy Father     Social History   Tobacco Use  . Smoking status: Former Smoker    Packs/day: 0.50    Years:  3.00    Pack years: 1.50    Types: Cigarettes  . Smokeless tobacco: Never Used  . Tobacco comment: quit 2005  Vaping Use  . Vaping Use: Never used  Substance Use Topics  . Alcohol use: No    Comment: social/ none during pregnacy  . Drug use: No    Home Medications Prior to Admission medications   Medication Sig Start Date End Date Taking? Authorizing Provider  acetaminophen (TYLENOL) 500 MG tablet Take 1,000 mg by mouth every 6 (six) hours as needed for mild pain, moderate pain or headache.     [provider]  atenolol-chlorthalidone (TENORETIC) 50-25 MG tablet atenolol 50 mg-chlorthalidone 25 mg tablet  TAKE 1 TABLET BY MOUTH ONCE A DAY    [provider]  calcium carbonate (TUMS - DOSED IN MG ELEMENTAL CALCIUM) 500 MG chewable tablet Chew 2 tablets by mouth as needed for indigestion or heartburn.      [provider]  ciprofloxacin (CIPRO) 500 MG tablet ciprofloxacin 500 mg tablet  TAKE 1 TABLET BY MOUTH EVERY 12 HOURS FOR 3 DAYS.    [provider]  clindamycin (CLEOCIN) 300 MG capsule clindamycin HCl 300 mg capsule  TAKE 1 CAPSULE(S) EVERY 8 HOURS BY ORAL ROUTE AS NEEDED FOR 7 DAYS.    [provider]  clonazePAM (KLONOPIN) 1 MG tablet clonazepam 1 mg tablet  TAKE 1/2 TABLET 1-3 TIMES A DAY    [provider]  escitalopram (LEXAPRO) 10 MG tablet Lexapro 10 mg tablet  TAKE 1 TABLET BY MOUTH ONCE A DAY    [provider]  escitalopram (LEXAPRO) 20 MG tablet escitalopram 20 mg tablet  TAKE 1 TABLET BY MOUTH ONCE A DAY    [provider]  ferrous sulfate 325 (65 FE) MG tablet Take 1 tablet (325 mg total) by mouth 2 (two) times daily with a meal. 07/05/16   Prothero, Henderson Newcomer, CNM  hydrOXYzine (ATARAX/VISTARIL) 25 MG tablet Take 1 tablet (25 mg total) by mouth every 6 (six) hours. 06/21/17   Janne Napoleon, NP  lisinopril-hydrochlorothiazide (ZESTORETIC) 20-12.5 MG tablet lisinopril 20 mg-hydrochlorothiazide 12.5 mg tablet  TAKE 1 TABLET BY MOUTH ONCE A DAY    [provider]  oxyCODONE-acetaminophen (PERCOCET/ROXICET) 5-325 MG tablet Take 1 tablet by mouth every 4 (four) hours as needed (pain scale 4-7). 07/05/16   Prothero, Henderson Newcomer, CNM  pantoprazole (PROTONIX) 40 MG tablet Take 40 mg by mouth daily.     [provider]  predniSONE (STERAPRED UNI-PAK 21 TAB) 10 MG (21) TBPK tablet Take by mouth daily. Take as directed. 01/05/20   Mardella Layman, MD  Prenatal Vit-Fe Fumarate-FA (PRENATAL MULTIVITAMIN) TABS tablet Take 1 tablet by mouth daily.     [provider]  ranitidine (ZANTAC) 150 MG tablet Take 1 tablet (150 mg total) by mouth 2 (two) times daily. 06/21/17   Janne Napoleon, NP    Allergies    Sulfa antibiotics and Amoxicillin  Review of Systems   Review of Systems  HENT: Positive for facial swelling.    All other systems reviewed and are negative.   Physical Exam Updated Vital Signs BP (!) 189/136 (BP Location: Right Arm)   Pulse 92   Temp 98.1 F (36.7 C) (Oral)   Resp (!) 24   SpO2 98%   Physical Exam Vitals and nursing note reviewed.  Constitutional:      Appearance: She is well-developed and well-nourished.  HENT:     Head: Normocephalic and  atraumatic.     Mouth/Throat:     Mouth: Oropharynx is clear and moist.     Comments: No lip/tongue swelling, handling secretions well, no stridor Eyes:     Extraocular Movements: EOM normal.     Conjunctiva/sclera: Conjunctivae normal.     Pupils: Pupils are equal, round, and reactive to light.     Comments: Puffiness around the eyes, skin appears irritated, eyes are mildly tearing without conjunctival injection/hemorrhage, PERRL, no facial cellulitis noted  Cardiovascular:     Rate and Rhythm: Normal rate and regular rhythm.     Heart sounds: Normal heart sounds.  Pulmonary:     Effort: Pulmonary effort is normal.     Breath sounds: Normal breath sounds.  Abdominal:     General: Bowel sounds are normal.     Palpations: Abdomen is soft.  Musculoskeletal:        General: Normal range of motion.     Cervical back: Normal range of motion.  Skin:    General: Skin is warm and dry.  Neurological:     Mental Status: She is alert and oriented to person, place, and time.  Psychiatric:        Mood and Affect: Mood and affect normal.     ED Results / Procedures / Treatments   Labs (all labs ordered are listed, but only abnormal results are displayed) Labs Reviewed - No data to display  EKG None  Radiology No results found.  Procedures Procedures  Medications Ordered in ED Medications  methylPREDNISolone sodium succinate (SOLU-MEDROL) 125 mg/2 mL injection 125 mg (125 mg Intravenous Given 10/21/20 0342)  sodium chloride 0.9 % bolus 1,000 mL (1,000 mLs Intravenous New Bag/Given 10/21/20 0344)  diphenhydrAMINE (BENADRYL)  injection 25 mg (25 mg Intravenous Given 10/21/20 0343)  famotidine (PEPCID) IVPB 20 mg premix (0 mg Intravenous Stopped 10/21/20 0427)    ED Course  I have reviewed the triage vital signs and the nursing notes.  Pertinent labs & imaging results that were available during my care of the patient were reviewed by me and considered in my medical decision making (see chart for details).    MDM Rules/Calculators/A&P  40 y.o. F here with allergic reaction, presumably to wasabi sauce as this is only new thing she can recall.  She does have puffiness around the eyes and skin appears dry causing eyes to tear but she does not have any lip or tongue swelling.  She is handling secretions well, normal phonation without stridor.  Denies shortness of breath.  Vitals are stable.  She denies any other changes in soaps, detergents, cosmetics, or other personal care products.  She has no known food allergies.  Will give IV fluids, Solu-Medrol, Benadryl, and Pepcid.  Will monitor.  4:50 AM Medications completed and symptoms have somewhat improved.  Itching has resolved which was bothering her the most.  Does still have some puffiness around the eyes.  Remains without any lip or tongue swelling, no airway compromise.  Vitals are stable.  Feel she stable for discharge home.  We will have her continue prednisone taper over the next few days along with PRN Benadryl and Pepcid.  Given facial edema, sent with EpiPen.  She is aware of indications for use and need for ER evaluation afterwards.  Skin around the eyes continues to appear dry and cracking causing her eyes to water.  We will send with a very low dose Kenalog ointment to use, advised extreme caution around the eyes not to get  this in the eye and to use smallest amount possible to prevent skin thinning/discoloration.  Close follow-up with PCP.  Return here for any new/acute changes.  Final Clinical Impression(s) / ED Diagnoses Final diagnoses:  Allergic reaction,  initial encounter    Rx / DC Orders ED Discharge Orders         Ordered    triamcinolone (KENALOG) 0.1 %  2 times daily        10/21/20 0451    EPINEPHrine (EPIPEN 2-PAK) 0.3 mg/0.3 mL IJ SOAJ injection  Once PRN        10/21/20 0451    famotidine (PEPCID) 20 MG tablet  2 times daily        10/21/20 0451           Garlon HatchetSanders, Xitlali Kastens M, PA-C 10/21/20 0503    Long, Arlyss RepressJoshua G, MD 10/22/20 [phone]

## 2021-05-30 ENCOUNTER — Encounter: Payer: Self-pay | Admitting: Emergency Medicine

## 2021-05-30 ENCOUNTER — Ambulatory Visit
Admission: EM | Admit: 2021-05-30 | Discharge: 2021-05-30 | Disposition: A | Discharge status: Home / Self Care | Payer: Medicaid Other | Attending: Physician Assistant | Admitting: Physician Assistant

## 2021-05-30 ENCOUNTER — Ambulatory Visit (INDEPENDENT_AMBULATORY_CARE_PROVIDER_SITE_OTHER): Payer: Medicaid Other

## 2021-05-30 ENCOUNTER — Other Ambulatory Visit: Payer: Self-pay

## 2021-05-30 DIAGNOSIS — M545 Low back pain, unspecified: Secondary | ICD-10-CM | POA: Diagnosis not present

## 2021-05-30 DIAGNOSIS — W19XXXA Unspecified fall, initial encounter: Secondary | ICD-10-CM

## 2021-05-30 MED ORDER — CYCLOBENZAPRINE HCL 10 MG PO TABS: 10.0000 mg | ORAL_TABLET | Freq: Two times a day (BID) | ORAL | 0 refills | Status: DC | PRN

## 2021-05-30 NOTE — ED Triage Notes (Signed)
Fall, states feet went out from under her and landed on her back. C/o right lower back/hip pain since fall. Denies numbness/tingling down leg, bowel/bladder incontinence, neck pain/headache

## 2021-05-30 NOTE — Discharge Instructions (Addendum)
Will prescribe muscle relaxer for hopeful improvement of pain. This may cause drowsiness. Can take ibuprofen as well if needed. Follow up if symptoms fail to improve or worsen in any way,

## 2021-05-30 NOTE — ED Provider Notes (Signed)
[facility]    CSN: [id number] Arrival date & time: 05/30/21  1816      History   Chief Complaint Chief Complaint  Patient presents with   Fall    HPI [name] is a 40 y.o. female.   Patient here today for evaluation of low back pain after fall earlier today. She states she was at work today and tripped over her own feet and fell onto her back. She reports initially she just hopped back up and started working because it was during his lunch rush. She states after her shift, on her way home she started to notice that pain was worsening in her lower back. She reports movement of her back causes more pain as does lying flat. She has not had any numbness or tingling. She denies any loss of bowel or bladder function. She does have history of compression fracture to her lower thoracic spine (bra line) from 2007. She does not report any treatment for symptoms.    Fall Pertinent negatives include no shortness of breath.   Past Medical History:  Diagnosis Date   Abnormal Pap smear    Anxiety    Headache(784.0)    otc meds prn - last one 2 wks ago   Pregnancy induced hypertension    Preterm labor    Vaginal Pap smear, abnormal     Patient Active Problem List   Diagnosis Date Noted   PPH (postpartum hemorrhage) 07/05/2016   Delivery of third pregnancy by cesarean section using transverse incision of lower segment of uterus 07/03/2016   Gestational hypertension w/o significant proteinuria in 3rd trimester 07/01/2016   Allergy history, sulfonamide 12/28/2015   Allergy to amoxicillin 12/28/2015   Obesity (BMI 30-39.9) 12/28/2015   Pregnancy complicated by prior cervical conization, antepartum (with biopsy) 12/28/2015   Anxiety 12/13/2011    Past Surgical History:  Procedure Laterality Date   CERVICAL CONIZATION W/BX  12/13/2011   Procedure: CONIZATION CERVIX WITH BIOPSY;  Surgeon: Hal Morales, MD;  Location: [facility];  Service: Gynecology;  Laterality: N/A;   Cold Knife   CESAREAN SECTION N/A 07/03/2016   Procedure: CESAREAN SECTION;  Surgeon: Hoover Browns, MD;  Location: [facility];  Service: Obstetrics;  Laterality: N/A;   CHOLECYSTECTOMY  2004   COLONOSCOPY     svd     x 2   UPPER GASTROINTESTINAL ENDOSCOPY     WISDOM TOOTH EXTRACTION      OB History     Gravida  3   Para  3   Term  2   Preterm  1   AB      Living  3      SAB      IAB      Ectopic      Multiple  0   Live Births  3            Home Medications    Prior to Admission medications   Medication Sig Start Date End Date Taking? Authorizing Provider  acetaminophen (TYLENOL) 500 MG tablet Take 1,000 mg by mouth every 6 (six) hours as needed for mild pain, moderate pain or headache.    Yes [provider]  cyclobenzaprine (FLEXERIL) 10 MG tablet Take 1 tablet (10 mg total) by mouth 2 (two) times daily as needed for muscle spasms. 05/30/21  Yes Tomi Bamberger, PA-C  atenolol-chlorthalidone (TENORETIC) 50-25 MG tablet atenolol 50 mg-chlorthalidone 25 mg tablet  TAKE 1 TABLET BY MOUTH ONCE  A DAY    [provider]  calcium carbonate (TUMS - DOSED IN MG ELEMENTAL CALCIUM) 500 MG chewable tablet Chew 2 tablets by mouth as needed for indigestion or heartburn.     [provider]  ciprofloxacin (CIPRO) 500 MG tablet ciprofloxacin 500 mg tablet  TAKE 1 TABLET BY MOUTH EVERY 12 HOURS FOR 3 DAYS.    [provider]  clindamycin (CLEOCIN) 300 MG capsule clindamycin HCl 300 mg capsule  TAKE 1 CAPSULE(S) EVERY 8 HOURS BY ORAL ROUTE AS NEEDED FOR 7 DAYS.    [provider]  clonazePAM (KLONOPIN) 1 MG tablet clonazepam 1 mg tablet  TAKE 1/2 TABLET 1-3 TIMES A DAY    [provider]  EPINEPHrine (EPIPEN 2-PAK) 0.3 mg/0.3 mL IJ SOAJ injection Inject 0.3 mg into the muscle once as needed for up to 1 dose (for severe allergic reaction). CAll 911 immediately if you have to use this medicine 10/21/20   Garlon Hatchet,  PA-C  escitalopram (LEXAPRO) 10 MG tablet Lexapro 10 mg tablet  TAKE 1 TABLET BY MOUTH ONCE A DAY    [provider]  escitalopram (LEXAPRO) 20 MG tablet escitalopram 20 mg tablet  TAKE 1 TABLET BY MOUTH ONCE A DAY    [provider]  famotidine (PEPCID) 20 MG tablet Take 1 tablet (20 mg total) by mouth 2 (two) times daily. 10/21/20   Garlon Hatchet, PA-C  ferrous sulfate 325 (65 FE) MG tablet Take 1 tablet (325 mg total) by mouth 2 (two) times daily with a meal. 07/05/16   Prothero, Henderson Newcomer, CNM  hydrOXYzine (ATARAX/VISTARIL) 25 MG tablet Take 1 tablet (25 mg total) by mouth every 6 (six) hours. 06/21/17   Janne Napoleon, NP  lisinopril-hydrochlorothiazide (ZESTORETIC) 20-12.5 MG tablet lisinopril 20 mg-hydrochlorothiazide 12.5 mg tablet  TAKE 1 TABLET BY MOUTH ONCE A DAY    [provider]  oxyCODONE-acetaminophen (PERCOCET/ROXICET) 5-325 MG tablet Take 1 tablet by mouth every 4 (four) hours as needed (pain scale 4-7). 07/05/16   Prothero, Henderson Newcomer, CNM  pantoprazole (PROTONIX) 40 MG tablet Take 40 mg by mouth daily.     [provider]  predniSONE (STERAPRED UNI-PAK 21 TAB) 10 MG (21) TBPK tablet Take by mouth daily. Take as directed. 01/05/20   Mardella Layman, MD  Prenatal Vit-Fe Fumarate-FA (PRENATAL MULTIVITAMIN) TABS tablet Take 1 tablet by mouth daily.     [provider]  ranitidine (ZANTAC) 150 MG tablet Take 1 tablet (150 mg total) by mouth 2 (two) times daily. 06/21/17   Janne Napoleon, NP  triamcinolone (KENALOG) 0.1 % Apply 1 application topically 2 (two) times daily. 10/21/20   Garlon Hatchet, PA-C    Family History Family History  Problem Relation Age of Onset   Diabetes Maternal Aunt    Diabetes Maternal Grandfather    Hypertension Maternal Grandfather    Diabetes Mother    Healthy Father     Social History Social History   Tobacco Use   Smoking status: Former    Packs/day: 0.50    Years: 3.00    Pack years: 1.50     Types: Cigarettes   Smokeless tobacco: Never   Tobacco comments:    quit 2005  Vaping Use   Vaping Use: Never used  Substance Use Topics   Alcohol use: No    Comment: social/ none during pregnacy   Drug use: No     Allergies   Sulfa antibiotics and Amoxicillin  Review of Systems Review of Systems  Constitutional:  Negative for fever.  Eyes:  Negative for discharge and redness.  Respiratory:  Negative for shortness of breath.   Musculoskeletal:  Positive for back pain and myalgias.  Neurological:  Negative for numbness.    Physical Exam Triage Vital Signs ED Triage Vitals  Enc Vitals Group     BP      Pulse      Resp      Temp      Temp src      SpO2      Weight      Height      Head Circumference      Peak Flow      Pain Score      Pain Loc      Pain Edu?      Excl. in GC?    No data found.  Updated Vital Signs BP (!) 169/116 (BP Location: Left Arm)   Pulse 100   Temp 98.3 F (36.8 C) (Oral)   Resp 16   SpO2 95%      Physical Exam Vitals and nursing note reviewed.  Constitutional:      General: She is not in acute distress.    Appearance: Normal appearance. She is not ill-appearing.  HENT:     Head: Normocephalic and atraumatic.  Eyes:     Conjunctiva/sclera: Conjunctivae normal.  Cardiovascular:     Rate and Rhythm: Normal rate.  Pulmonary:     Effort: Pulmonary effort is normal.  Musculoskeletal:     Comments: TTP noted to lower spine, across lower back.   Neurological:     Mental Status: She is alert.  Psychiatric:        Mood and Affect: Mood normal.        Behavior: Behavior normal.        Thought Content: Thought content normal.     UC Treatments / Results  Labs (all labs ordered are listed, but only abnormal results are displayed) Labs Reviewed - No data to display  EKG   Radiology DG Lumbar Spine Complete  Result Date: 05/30/2021 CLINICAL DATA:  Fall and back pain. EXAM: LUMBAR SPINE - COMPLETE 4+ VIEW COMPARISON:   Lumbar spine radiograph dated 04/09/2009. FINDINGS: Five lumbar type vertebra. There is no acute fracture subluxation of the lumbar spine. Degenerative changes primarily at L5-S1 with disc space narrowing. Chronic compression fractures of lower thoracic spine with approximately 30% loss of vertebral body height at T11 and mild anterior wedging. The visualized posterior elements are intact. Bilateral renal calculi measure up to 7 mm on the left. Right upper quadrant cholecystectomy clips. The soft tissues are unremarkable IMPRESSION: 1. No acute/traumatic lumbar spine pathology. 2. Bilateral renal calculi. Electronically Signed   By: Elgie Collard M.D.   On: 05/30/2021 19:36    Procedures Procedures (including critical care time)  Medications Ordered in UC Medications - No data to display  Initial Impression / Assessment and Plan / UC Course  I have reviewed the triage vital signs and the nursing notes.  Pertinent labs & imaging results that were available during my care of the patient were reviewed by me and considered in my medical decision making (see chart for details).  Xray without fracture. Muscle relaxer prescribed. Recommend follow up with any further concerns or if back pain does not start to improve over the next few days.    Final Clinical Impressions(s) / UC Diagnoses   Final diagnoses:  Fall, initial encounter  Acute bilateral low back pain, unspecified whether sciatica present     Discharge Instructions      Will prescribe muscle relaxer for hopeful improvement of pain. This may cause drowsiness. Can take ibuprofen as well if needed. Follow up if symptoms fail to improve or worsen in any way,      ED Prescriptions     Medication Sig Dispense Auth. Provider   cyclobenzaprine (FLEXERIL) 10 MG tablet Take 1 tablet (10 mg total) by mouth 2 (two) times daily as needed for muscle spasms. 20 tablet Tomi Bamberger, PA-C      PDMP not reviewed this encounter.    Tomi Bamberger, PA-C 05/30/21 1947

## 2022-01-10 ENCOUNTER — Ambulatory Visit
Admission: EM | Admit: 2022-01-10 | Discharge: 2022-01-10 | Disposition: A | Discharge status: Home / Self Care | Payer: Medicaid Other | Attending: Physician Assistant | Admitting: Physician Assistant

## 2022-01-10 ENCOUNTER — Encounter: Payer: Self-pay | Admitting: Emergency Medicine

## 2022-01-10 DIAGNOSIS — T7840XA Allergy, unspecified, initial encounter: Secondary | ICD-10-CM

## 2022-01-10 DIAGNOSIS — L299 Pruritus, unspecified: Secondary | ICD-10-CM | POA: Diagnosis not present

## 2022-01-10 DIAGNOSIS — H5789 Other specified disorders of eye and adnexa: Secondary | ICD-10-CM | POA: Diagnosis not present

## 2022-01-10 MED ORDER — PREDNISONE 10 MG (21) PO TBPK: ORAL_TABLET | Freq: Every day | ORAL | 0 refills | Status: DC

## 2022-01-10 NOTE — ED Triage Notes (Signed)
Pt is present today with bilateral eye irritation. Pt sx started last night. Pt states that the only thing she can recall eating different is eating hot sauce which she may have had an allergic reaction to previously. ?

## 2022-01-10 NOTE — Discharge Instructions (Signed)
Continue Benadryl 25mg  every 6 hours for itching. ?Continue the Zyrtec daily for next 10 days. ?Take medication as directed. ?Return if symptoms fail to improve or worsens over the next 72 hours. ?

## 2022-01-10 NOTE — ED Provider Notes (Signed)
?[facility] ? ? ? ?CSN: [id number] ?Arrival date & time: 01/10/22  1243 ? ? ?  ? ?History   ?Chief Complaint ?Chief Complaint  ?Patient presents with  ? Itchy Eye  ? Allergic Reaction  ? ? ?HPI ?[name] is a 41 y.o. female.  ? ?41 y/o female presents with sudden onset of bilateral eye rash, and swelling.  Patient relates using "[employer]" hot sauce last night and then early this morning she awoke with bilateral eye swelling, redness, and itching.  Patient relates she had a similar reaction when using [address] [employer]" several months ago.  Patient relates she has not eaten any other foods that may cause a reaction.  Patient relates she is not having mouth itching, difficulty swallowing, sob or wheezing.  Patient relates the rash and itching is confined to the eyes and periorbital areas.  She is taking Benadryl and Zyrtec with minimal relief. ? ? ?Allergic Reaction ? ?Past Medical History:  ?Diagnosis Date  ? Abnormal Pap smear   ? Anxiety   ? Headache(784.0)   ? otc meds prn - last one 2 wks ago  ? Pregnancy induced hypertension   ? Preterm labor   ? Vaginal Pap smear, abnormal   ? ? ?Patient Active Problem List  ? Diagnosis Date Noted  ? PPH (postpartum hemorrhage) 07/05/2016  ? Delivery of third pregnancy by cesarean section using transverse incision of lower segment of uterus 07/03/2016  ? Gestational hypertension w/o significant proteinuria in 3rd trimester 07/01/2016  ? Allergy history, sulfonamide 12/28/2015  ? Allergy to amoxicillin 12/28/2015  ? Obesity (BMI 30-39.9) 12/28/2015  ? Pregnancy complicated by prior cervical conization, antepartum (with biopsy) 12/28/2015  ? Anxiety 12/13/2011  ? ? ?Past Surgical History:  ?Procedure Laterality Date  ? CERVICAL CONIZATION W/BX  12/13/2011  ? Procedure: CONIZATION CERVIX WITH BIOPSY;  Surgeon: Hal MoralesVanessa P Haygood, MD;  Location: [facility];  Service: Gynecology;  Laterality: N/A;  Cold Knife  ? CESAREAN SECTION N/A 07/03/2016  ? Procedure: CESAREAN SECTION;   Surgeon: Hoover BrownsEma Kulwa, MD;  Location: [facility];  Service: Obstetrics;  Laterality: N/A;  ? CHOLECYSTECTOMY  2004  ? COLONOSCOPY    ? svd    ? x 2  ? UPPER GASTROINTESTINAL ENDOSCOPY    ? WISDOM TOOTH EXTRACTION    ? ? ?OB History   ? ? Gravida  ?3  ? Para  ?3  ? Term  ?2  ? Preterm  ?1  ? AB  ?   ? Living  ?3  ?  ? ? SAB  ?   ? IAB  ?   ? Ectopic  ?   ? Multiple  ?0  ? Live Births  ?3  ?   ?  ?  ? ? ? ?Home Medications   ? ?Prior to Admission medications   ?Medication Sig Start Date End Date Taking? Authorizing Provider  ?acetaminophen (TYLENOL) 500 MG tablet Take 1,000 mg by mouth every 6 (six) hours as needed for mild pain, moderate pain or headache.     [provider]  ?atenolol-chlorthalidone (TENORETIC) 50-25 MG tablet atenolol 50 mg-chlorthalidone 25 mg tablet ? TAKE 1 TABLET BY MOUTH ONCE A DAY    [provider]  ?calcium carbonate (TUMS - DOSED IN MG ELEMENTAL CALCIUM) 500 MG chewable tablet Chew 2 tablets by mouth as needed for indigestion or heartburn.     [provider]  ?ciprofloxacin (CIPRO) 500 MG tablet ciprofloxacin 500 mg tablet ?  TAKE 1 TABLET BY MOUTH EVERY 12 HOURS FOR 3 DAYS.    [provider]  ?clindamycin (CLEOCIN) 300 MG capsule clindamycin HCl 300 mg capsule ? TAKE 1 CAPSULE(S) EVERY 8 HOURS BY ORAL ROUTE AS NEEDED FOR 7 DAYS.    [provider]  ?clonazePAM (KLONOPIN) 1 MG tablet clonazepam 1 mg tablet ? TAKE 1/2 TABLET 1-3 TIMES A DAY    [provider]  ?cyclobenzaprine (FLEXERIL) 10 MG tablet Take 1 tablet (10 mg total) by mouth 2 (two) times daily as needed for muscle spasms. 05/30/21   Tomi Bamberger, PA-C  ?EPINEPHrine (EPIPEN 2-PAK) 0.3 mg/0.3 mL IJ SOAJ injection Inject 0.3 mg into the muscle once as needed for up to 1 dose (for severe allergic reaction). CAll 911 immediately if you have to use this medicine 10/21/20   Garlon Hatchet, PA-C  ?escitalopram (LEXAPRO) 10 MG tablet Lexapro 10 mg tablet ? TAKE 1 TABLET BY  MOUTH ONCE A DAY    [provider]  ?escitalopram (LEXAPRO) 20 MG tablet escitalopram 20 mg tablet ? TAKE 1 TABLET BY MOUTH ONCE A DAY    [provider]  ?famotidine (PEPCID) 20 MG tablet Take 1 tablet (20 mg total) by mouth 2 (two) times daily. 10/21/20   Garlon Hatchet, PA-C  ?ferrous sulfate 325 (65 FE) MG tablet Take 1 tablet (325 mg total) by mouth 2 (two) times daily with a meal. 07/05/16   Prothero, Henderson Newcomer, CNM  ?hydrOXYzine (ATARAX/VISTARIL) 25 MG tablet Take 1 tablet (25 mg total) by mouth every 6 (six) hours. 06/21/17   Janne Napoleon, NP  ?lisinopril-hydrochlorothiazide (ZESTORETIC) 20-12.5 MG tablet lisinopril 20 mg-hydrochlorothiazide 12.5 mg tablet ? TAKE 1 TABLET BY MOUTH ONCE A DAY    [provider]  ?oxyCODONE-acetaminophen (PERCOCET/ROXICET) 5-325 MG tablet Take 1 tablet by mouth every 4 (four) hours as needed (pain scale 4-7). 07/05/16   Prothero, Henderson Newcomer, CNM  ?pantoprazole (PROTONIX) 40 MG tablet Take 40 mg by mouth daily.     [provider]  ?predniSONE (STERAPRED UNI-PAK 21 TAB) 10 MG (21) TBPK tablet Take by mouth daily. Take as directed. 01/10/22   Ellsworth Lennox, PA-C  ?Prenatal Vit-Fe Fumarate-FA (PRENATAL MULTIVITAMIN) TABS tablet Take 1 tablet by mouth daily.     [provider]  ?ranitidine (ZANTAC) 150 MG tablet Take 1 tablet (150 mg total) by mouth 2 (two) times daily. 06/21/17   Janne Napoleon, NP  ?triamcinolone (KENALOG) 0.1 % Apply 1 application topically 2 (two) times daily. 10/21/20   Garlon Hatchet, PA-C  ? ? ?Family History ?Family History  ?Problem Relation Age of Onset  ? Diabetes Maternal Aunt   ? Diabetes Maternal Grandfather   ? Hypertension Maternal Grandfather   ? Diabetes Mother   ? Healthy Father   ? ? ?Social History ?Social History  ? ?Tobacco Use  ? Smoking status: Former  ?  Packs/day: 0.50  ?  Years: 3.00  ?  Pack years: 1.50  ?  Types: Cigarettes  ? Smokeless tobacco: Never  ? Tobacco comments:  ?  quit 2005   ?Vaping Use  ? Vaping Use: Never used  ?Substance Use Topics  ? Alcohol use: No  ?  Comment: social/ none during pregnacy  ? Drug use: No  ? ? ? ?Allergies   ?Sulfa antibiotics and Amoxicillin ? ? ?Review of Systems ?Review of Systems  ?HENT:  Positive for facial swelling.   ? ? ?Physical Exam ?Triage Vital  Signs ?ED Triage Vitals  ?Enc Vitals Group  ?   BP 01/10/22 1302 (!) 183/91  ?   Pulse Rate 01/10/22 1302 (!) 107  ?   Resp --   ?   Temp 01/10/22 1302 99 ?F (37.2 ?C)  ?   Temp Source 01/10/22 1302 Oral  ?   SpO2 01/10/22 1302 98 %  ?   Weight --   ?   Height --   ?   Head Circumference --   ?   Peak Flow --   ?   Pain Score 01/10/22 1301 0  ?   Pain Loc --   ?   Pain Edu? --   ?   Excl. in GC? --   ? ?No data found. ? ?Updated Vital Signs ?BP (!) 183/91   Pulse (!) 107   Temp 99 ?F (37.2 ?C) (Oral)   SpO2 98%  ? ?Visual Acuity ?Right Eye Distance:   ?Left Eye Distance:   ?Bilateral Distance:   ? ?Right Eye Near:   ?Left Eye Near:    ?Bilateral Near:    ? ?Physical Exam ?Constitutional:   ?   Appearance: Normal appearance.  ?HENT:  ?   Right Ear: Tympanic membrane and ear canal normal.  ?   Left Ear: Tympanic membrane and ear canal normal.  ?   Mouth/Throat:  ?   Mouth: Mucous membranes are moist.  ?   Pharynx: Oropharynx is clear.  ?   Comments: Mouth: pharynx clear without redness, lips without swelling. ?Eyes:  ?   Comments: Eye Lids: bilateral moderate swelling periorbital areas, no drainage. ?Eyes: EOMI, conjunctiva normal. ?Facial: no swelling present bilateral cheek areas.  ?Neurological:  ?   Mental Status: She is alert.  ? ? ? ?UC Treatments / Results  ?Labs ?(all labs ordered are listed, but only abnormal results are displayed) ?Labs Reviewed - No data to display ? ?EKG ? ? ?Radiology ?No results found. ? ?Procedures ?Procedures (including critical care time) ? ?Medications Ordered in UC ?Medications - No data to display ? ?Initial Impression / Assessment and Plan / UC Course  ?I have reviewed the  triage vital signs and the nursing notes. ? ?Pertinent labs & imaging results that were available during my care of the patient were reviewed by me and considered in my medical decision making (see chart for

## 2022-05-21 ENCOUNTER — Ambulatory Visit
Admission: EM | Admit: 2022-05-21 | Discharge: 2022-05-21 | Disposition: A | Discharge status: Home / Self Care | Payer: Medicaid Other | Attending: Physician Assistant | Admitting: Physician Assistant

## 2022-05-21 DIAGNOSIS — H65191 Other acute nonsuppurative otitis media, right ear: Secondary | ICD-10-CM | POA: Diagnosis not present

## 2022-05-21 DIAGNOSIS — J019 Acute sinusitis, unspecified: Secondary | ICD-10-CM

## 2022-05-21 MED ORDER — DOXYCYCLINE HYCLATE 100 MG PO CAPS: 100.0000 mg | ORAL_CAPSULE | Freq: Two times a day (BID) | ORAL | 0 refills | Status: DC

## 2022-05-21 NOTE — ED Provider Notes (Signed)
[facility]    CSN: [id number] Arrival date & time: 05/21/22  1842      History   Chief Complaint No chief complaint on file.   HPI [name] is a 41 y.o. female.   Patient here today for evaluation of sinus pressure, and right ear pain that started today.  She reports that she has had upper respiratory symptoms for the last week but recently developed more significant nasal congestion and pressure, and ear pain began today.  She has not had any fever.  She has not had significant cough, nausea, vomiting or diarrhea. She has not had pain in left ear.  She has tried alternating Tylenol and Motrin as well as Sudafed without resolution.  The history is provided by the patient.    Past Medical History:  Diagnosis Date   Abnormal Pap smear    Anxiety    Headache(784.0)    otc meds prn - last one 2 wks ago   Pregnancy induced hypertension    Preterm labor    Vaginal Pap smear, abnormal     Patient Active Problem List   Diagnosis Date Noted   PPH (postpartum hemorrhage) 07/05/2016   Delivery of third pregnancy by cesarean section using transverse incision of lower segment of uterus 07/03/2016   Gestational hypertension w/o significant proteinuria in 3rd trimester 07/01/2016   Allergy history, sulfonamide 12/28/2015   Allergy to amoxicillin 12/28/2015   Obesity (BMI 30-39.9) 12/28/2015   Pregnancy complicated by prior cervical conization, antepartum (with biopsy) 12/28/2015   Anxiety 12/13/2011    Past Surgical History:  Procedure Laterality Date   CERVICAL CONIZATION W/BX  12/13/2011   Procedure: CONIZATION CERVIX WITH BIOPSY;  Surgeon: Hal Morales, MD;  Location: [facility];  Service: Gynecology;  Laterality: N/A;  Cold Knife   CESAREAN SECTION N/A 07/03/2016   Procedure: CESAREAN SECTION;  Surgeon: Hoover Browns, MD;  Location: [facility];  Service: Obstetrics;  Laterality: N/A;   CHOLECYSTECTOMY  2004   COLONOSCOPY     svd     x 2   UPPER  GASTROINTESTINAL ENDOSCOPY     WISDOM TOOTH EXTRACTION      OB History     Gravida  3   Para  3   Term  2   Preterm  1   AB      Living  3      SAB      IAB      Ectopic      Multiple  0   Live Births  3            Home Medications    Prior to Admission medications   Medication Sig Start Date End Date Taking? Authorizing Provider  doxycycline (VIBRAMYCIN) 100 MG capsule Take 1 capsule (100 mg total) by mouth 2 (two) times daily. 05/21/22  Yes Tomi Bamberger, PA-C  acetaminophen (TYLENOL) 500 MG tablet Take 1,000 mg by mouth every 6 (six) hours as needed for mild pain, moderate pain or headache.     [provider]  atenolol-chlorthalidone (TENORETIC) 50-25 MG tablet atenolol 50 mg-chlorthalidone 25 mg tablet  TAKE 1 TABLET BY MOUTH ONCE A DAY    [provider]  calcium carbonate (TUMS - DOSED IN MG ELEMENTAL CALCIUM) 500 MG chewable tablet Chew 2 tablets by mouth as needed for indigestion or heartburn.     [provider]  ciprofloxacin (CIPRO) 500 MG tablet ciprofloxacin 500 mg tablet  TAKE 1  TABLET BY MOUTH EVERY 12 HOURS FOR 3 DAYS.    [provider]  clindamycin (CLEOCIN) 300 MG capsule clindamycin HCl 300 mg capsule  TAKE 1 CAPSULE(S) EVERY 8 HOURS BY ORAL ROUTE AS NEEDED FOR 7 DAYS.    [provider]  clonazePAM (KLONOPIN) 1 MG tablet clonazepam 1 mg tablet  TAKE 1/2 TABLET 1-3 TIMES A DAY    [provider]  cyclobenzaprine (FLEXERIL) 10 MG tablet Take 1 tablet (10 mg total) by mouth 2 (two) times daily as needed for muscle spasms. 05/30/21   Tomi Bamberger, PA-C  EPINEPHrine (EPIPEN 2-PAK) 0.3 mg/0.3 mL IJ SOAJ injection Inject 0.3 mg into the muscle once as needed for up to 1 dose (for severe allergic reaction). CAll 911 immediately if you have to use this medicine 10/21/20   Garlon Hatchet, PA-C  escitalopram (LEXAPRO) 10 MG tablet Lexapro 10 mg tablet  TAKE 1 TABLET BY MOUTH ONCE A DAY     [provider]  escitalopram (LEXAPRO) 20 MG tablet escitalopram 20 mg tablet  TAKE 1 TABLET BY MOUTH ONCE A DAY    [provider]  famotidine (PEPCID) 20 MG tablet Take 1 tablet (20 mg total) by mouth 2 (two) times daily. 10/21/20   Garlon Hatchet, PA-C  ferrous sulfate 325 (65 FE) MG tablet Take 1 tablet (325 mg total) by mouth 2 (two) times daily with a meal. 07/05/16   Prothero, Henderson Newcomer, CNM  hydrOXYzine (ATARAX/VISTARIL) 25 MG tablet Take 1 tablet (25 mg total) by mouth every 6 (six) hours. 06/21/17   Janne Napoleon, NP  lisinopril-hydrochlorothiazide (ZESTORETIC) 20-12.5 MG tablet lisinopril 20 mg-hydrochlorothiazide 12.5 mg tablet  TAKE 1 TABLET BY MOUTH ONCE A DAY    [provider]  oxyCODONE-acetaminophen (PERCOCET/ROXICET) 5-325 MG tablet Take 1 tablet by mouth every 4 (four) hours as needed (pain scale 4-7). 07/05/16   Prothero, Henderson Newcomer, CNM  pantoprazole (PROTONIX) 40 MG tablet Take 40 mg by mouth daily.     [provider]  predniSONE (STERAPRED UNI-PAK 21 TAB) 10 MG (21) TBPK tablet Take by mouth daily. Take as directed. 01/10/22   Ellsworth Lennox, PA-C  Prenatal Vit-Fe Fumarate-FA (PRENATAL MULTIVITAMIN) TABS tablet Take 1 tablet by mouth daily.     [provider]  ranitidine (ZANTAC) 150 MG tablet Take 1 tablet (150 mg total) by mouth 2 (two) times daily. 06/21/17   Janne Napoleon, NP  triamcinolone (KENALOG) 0.1 % Apply 1 application topically 2 (two) times daily. 10/21/20   Garlon Hatchet, PA-C    Family History Family History  Problem Relation Age of Onset   Diabetes Maternal Aunt    Diabetes Maternal Grandfather    Hypertension Maternal Grandfather    Diabetes Mother    Healthy Father     Social History Social History   Tobacco Use   Smoking status: Former    Packs/day: 0.50    Years: 3.00    Total pack years: 1.50    Types: Cigarettes   Smokeless tobacco: Never   Tobacco comments:    quit 2005  Vaping Use    Vaping Use: Never used  Substance Use Topics   Alcohol use: No    Comment: social/ none during pregnacy   Drug use: No     Allergies   Sulfa antibiotics and Amoxicillin   Review of Systems Review of Systems  Constitutional:  Negative for chills and fever.  HENT:  Positive for congestion, ear pain  and sinus pressure. Negative for ear discharge and sore throat.   Eyes:  Negative for discharge and redness.  Respiratory:  Negative for cough, shortness of breath and wheezing.   Gastrointestinal:  Negative for diarrhea, nausea and vomiting.     Physical Exam Triage Vital Signs ED Triage Vitals  Enc Vitals Group     BP 05/21/22 1851 (!) 196/99     Pulse Rate 05/21/22 1849 (!) 101     Resp 05/21/22 1849 14     Temp 05/21/22 1849 98.2 F (36.8 C)     Temp src --      SpO2 05/21/22 1849 98 %     Weight --      Height --      Head Circumference --      Peak Flow --      Pain Score 05/21/22 1851 5     Pain Loc --      Pain Edu? --      Excl. in GC? --    No data found.  Updated Vital Signs BP (!) 180/86   Pulse (!) 101   Temp 98.2 F (36.8 C)   Resp 14   LMP 04/28/2022 (Approximate)   SpO2 98%      Physical Exam Vitals and nursing note reviewed.  Constitutional:      General: She is not in acute distress.    Appearance: Normal appearance. She is not ill-appearing.  HENT:     Head: Normocephalic and atraumatic.     Left Ear: Tympanic membrane normal.     Ears:     Comments: Erythematous right TM    Nose: Congestion present.  Eyes:     Conjunctiva/sclera: Conjunctivae normal.  Cardiovascular:     Rate and Rhythm: Normal rate.  Pulmonary:     Effort: Pulmonary effort is normal. No respiratory distress.  Skin:    General: Skin is warm and dry.  Neurological:     Mental Status: She is alert.  Psychiatric:        Mood and Affect: Mood normal.        Thought Content: Thought content normal.      UC Treatments / Results  Labs (all labs ordered are  listed, but only abnormal results are displayed) Labs Reviewed - No data to display  EKG   Radiology No results found.  Procedures Procedures (including critical care time)  Medications Ordered in UC Medications - No data to display  Initial Impression / Assessment and Plan / UC Course  I have reviewed the triage vital signs and the nursing notes.  Pertinent labs & imaging results that were available during my care of the patient were reviewed by me and considered in my medical decision making (see chart for details).    Augmentin prescribed to cover sinusitis as well as otitis media.  Recommended follow-up if no gradual improvement or with any further concerns.  Discussed that Sudafed may be causing elevated blood pressure as well as pain.  Should have BP rechecked when she is feeling well but does report she has had elevated blood pressure when she has not felt well in the past.   Final Clinical Impressions(s) / UC Diagnoses   Final diagnoses:  Acute sinusitis, recurrence not specified, unspecified location  Other acute nonsuppurative otitis media of right ear, recurrence not specified   Discharge Instructions   None    ED Prescriptions     Medication Sig Dispense Auth. Provider   doxycycline (VIBRAMYCIN)  100 MG capsule Take 1 capsule (100 mg total) by mouth 2 (two) times daily. 20 capsule Tomi Bamberger, PA-C      PDMP not reviewed this encounter.   Tomi Bamberger, PA-C 05/21/22 1906

## 2022-05-21 NOTE — ED Triage Notes (Addendum)
Pt presents to uc with co of sinus pressure, and otalgia. Pt reports she sinus cold started one week ago and today the otalgia began. Pt has been alternating tylenol motrin and taking sudafed

## 2022-06-17 ENCOUNTER — Ambulatory Visit
Admission: EM | Admit: 2022-06-17 | Discharge: 2022-06-17 | Disposition: A | Discharge status: Home / Self Care | Payer: Medicaid Other | Attending: Physician Assistant | Admitting: Physician Assistant

## 2022-06-17 DIAGNOSIS — J209 Acute bronchitis, unspecified: Secondary | ICD-10-CM

## 2022-06-17 MED ORDER — PREDNISONE 20 MG PO TABS: 40.0000 mg | ORAL_TABLET | Freq: Every day | ORAL | 0 refills | Status: AC

## 2022-06-17 NOTE — ED Provider Notes (Signed)
[facility]    CSN: [id number] Arrival date & time: 06/17/22  0951      History   Chief Complaint Chief Complaint  Patient presents with   URI    HPI [name] is a 41 y.o. female.   Patient here today for evaluation of chest congestion nonproductive cough present for a week.  She has tried over-the-counter medication without significant relief.  He has not had fever.  She denies any sore throat or ear pain.  She denies any nausea, vomiting or diarrhea.  The history is provided by the patient.  URI Presenting symptoms: congestion and cough   Presenting symptoms: no fever and no sore throat     Past Medical History:  Diagnosis Date   Abnormal Pap smear    Anxiety    Headache(784.0)    otc meds prn - last one 2 wks ago   Pregnancy induced hypertension    Preterm labor    Vaginal Pap smear, abnormal     Patient Active Problem List   Diagnosis Date Noted   PPH (postpartum hemorrhage) 07/05/2016   Delivery of third pregnancy by cesarean section using transverse incision of lower segment of uterus 07/03/2016   Gestational hypertension w/o significant proteinuria in 3rd trimester 07/01/2016   Allergy history, sulfonamide 12/28/2015   Allergy to amoxicillin 12/28/2015   Obesity (BMI 30-39.9) 12/28/2015   Pregnancy complicated by prior cervical conization, antepartum (with biopsy) 12/28/2015   Anxiety 12/13/2011    Past Surgical History:  Procedure Laterality Date   CERVICAL CONIZATION W/BX  12/13/2011   Procedure: CONIZATION CERVIX WITH BIOPSY;  Surgeon: Hal Morales, MD;  Location: [facility];  Service: Gynecology;  Laterality: N/A;  Cold Knife   CESAREAN SECTION N/A 07/03/2016   Procedure: CESAREAN SECTION;  Surgeon: Hoover Browns, MD;  Location: [facility];  Service: Obstetrics;  Laterality: N/A;   CHOLECYSTECTOMY  2004   COLONOSCOPY     svd     x 2   UPPER GASTROINTESTINAL ENDOSCOPY     WISDOM TOOTH EXTRACTION      OB History      Gravida  3   Para  3   Term  2   Preterm  1   AB      Living  3      SAB      IAB      Ectopic      Multiple  0   Live Births  3            Home Medications    Prior to Admission medications   Medication Sig Start Date End Date Taking? Authorizing Provider  predniSONE (DELTASONE) 20 MG tablet Take 2 tablets (40 mg total) by mouth daily with breakfast for 5 days. 06/17/22 06/22/22 Yes Tomi Bamberger, PA-C  acetaminophen (TYLENOL) 500 MG tablet Take 1,000 mg by mouth every 6 (six) hours as needed for mild pain, moderate pain or headache.     [provider]  atenolol-chlorthalidone (TENORETIC) 50-25 MG tablet atenolol 50 mg-chlorthalidone 25 mg tablet  TAKE 1 TABLET BY MOUTH ONCE A DAY    [provider]  calcium carbonate (TUMS - DOSED IN MG ELEMENTAL CALCIUM) 500 MG chewable tablet Chew 2 tablets by mouth as needed for indigestion or heartburn.     [provider]  ciprofloxacin (CIPRO) 500 MG tablet ciprofloxacin 500 mg tablet  TAKE 1 TABLET BY MOUTH EVERY 12 HOURS FOR 3 DAYS.  [provider]  clindamycin (CLEOCIN) 300 MG capsule clindamycin HCl 300 mg capsule  TAKE 1 CAPSULE(S) EVERY 8 HOURS BY ORAL ROUTE AS NEEDED FOR 7 DAYS.    [provider]  clonazePAM (KLONOPIN) 1 MG tablet clonazepam 1 mg tablet  TAKE 1/2 TABLET 1-3 TIMES A DAY    [provider]  cyclobenzaprine (FLEXERIL) 10 MG tablet Take 1 tablet (10 mg total) by mouth 2 (two) times daily as needed for muscle spasms. 05/30/21   Tomi Bamberger, PA-C  doxycycline (VIBRAMYCIN) 100 MG capsule Take 1 capsule (100 mg total) by mouth 2 (two) times daily. 05/21/22   Tomi Bamberger, PA-C  EPINEPHrine (EPIPEN 2-PAK) 0.3 mg/0.3 mL IJ SOAJ injection Inject 0.3 mg into the muscle once as needed for up to 1 dose (for severe allergic reaction). CAll 911 immediately if you have to use this medicine 10/21/20   Garlon Hatchet, PA-C  escitalopram (LEXAPRO) 10 MG  tablet Lexapro 10 mg tablet  TAKE 1 TABLET BY MOUTH ONCE A DAY    [provider]  escitalopram (LEXAPRO) 20 MG tablet escitalopram 20 mg tablet  TAKE 1 TABLET BY MOUTH ONCE A DAY    [provider]  famotidine (PEPCID) 20 MG tablet Take 1 tablet (20 mg total) by mouth 2 (two) times daily. 10/21/20   Garlon Hatchet, PA-C  ferrous sulfate 325 (65 FE) MG tablet Take 1 tablet (325 mg total) by mouth 2 (two) times daily with a meal. 07/05/16   Prothero, Henderson Newcomer, CNM  hydrOXYzine (ATARAX/VISTARIL) 25 MG tablet Take 1 tablet (25 mg total) by mouth every 6 (six) hours. 06/21/17   Janne Napoleon, NP  lisinopril-hydrochlorothiazide (ZESTORETIC) 20-12.5 MG tablet lisinopril 20 mg-hydrochlorothiazide 12.5 mg tablet  TAKE 1 TABLET BY MOUTH ONCE A DAY    [provider]  oxyCODONE-acetaminophen (PERCOCET/ROXICET) 5-325 MG tablet Take 1 tablet by mouth every 4 (four) hours as needed (pain scale 4-7). 07/05/16   Prothero, Henderson Newcomer, CNM  pantoprazole (PROTONIX) 40 MG tablet Take 40 mg by mouth daily.     [provider]  Prenatal Vit-Fe Fumarate-FA (PRENATAL MULTIVITAMIN) TABS tablet Take 1 tablet by mouth daily.     [provider]  ranitidine (ZANTAC) 150 MG tablet Take 1 tablet (150 mg total) by mouth 2 (two) times daily. 06/21/17   Janne Napoleon, NP  triamcinolone (KENALOG) 0.1 % Apply 1 application topically 2 (two) times daily. 10/21/20   Garlon Hatchet, PA-C    Family History Family History  Problem Relation Age of Onset   Diabetes Maternal Aunt    Diabetes Maternal Grandfather    Hypertension Maternal Grandfather    Diabetes Mother    Healthy Father     Social History Social History   Tobacco Use   Smoking status: Former    Packs/day: 0.50    Years: 3.00    Total pack years: 1.50    Types: Cigarettes   Smokeless tobacco: Never   Tobacco comments:    quit 2005  Vaping Use   Vaping Use: Never used  Substance Use Topics   Alcohol use: No     Comment: social/ none during pregnacy   Drug use: No     Allergies   Sulfa antibiotics and Amoxicillin   Review of Systems Review of Systems  Constitutional:  Negative for chills and fever.  HENT:  Positive for congestion. Negative for sore throat.   Eyes:  Negative for discharge and redness.  Respiratory:  Positive for cough. Negative for shortness of breath.   Gastrointestinal:  Negative for diarrhea, nausea and vomiting.     Physical Exam Triage Vital Signs ED Triage Vitals  Enc Vitals Group     BP 06/17/22 1123 (!) 180/128     Pulse Rate 06/17/22 1123 (!) 105     Resp 06/17/22 1123 18     Temp 06/17/22 1123 97.9 F (36.6 C)     Temp Source 06/17/22 1123 Oral     SpO2 06/17/22 1123 96 %     Weight --      Height --      Head Circumference --      Peak Flow --      Pain Score 06/17/22 1130 4     Pain Loc --      Pain Edu? --      Excl. in GC? --    No data found.  Updated Vital Signs BP (!) 180/128 (BP Location: Right Arm)   Pulse (!) 105   Temp 97.9 F (36.6 C) (Oral)   Resp 18   LMP 05/22/2022 (Approximate)   SpO2 96%      Physical Exam Vitals and nursing note reviewed.  Constitutional:      General: She is not in acute distress.    Appearance: Normal appearance. She is not ill-appearing.  HENT:     Head: Normocephalic and atraumatic.     Nose: Congestion present.     Mouth/Throat:     Mouth: Mucous membranes are moist.     Pharynx: No oropharyngeal exudate or posterior oropharyngeal erythema.  Eyes:     Conjunctiva/sclera: Conjunctivae normal.  Cardiovascular:     Rate and Rhythm: Normal rate and regular rhythm.     Heart sounds: Normal heart sounds. No murmur heard. Pulmonary:     Effort: Pulmonary effort is normal. No respiratory distress.     Breath sounds: Normal breath sounds. No wheezing, rhonchi or rales.  Skin:    General: Skin is warm and dry.  Neurological:     Mental Status: She is alert.  Psychiatric:        Mood and  Affect: Mood normal.        Thought Content: Thought content normal.      UC Treatments / Results  Labs (all labs ordered are listed, but only abnormal results are displayed) Labs Reviewed - No data to display  EKG   Radiology No results found.  Procedures Procedures (including critical care time)  Medications Ordered in UC Medications - No data to display  Initial Impression / Assessment and Plan / UC Course  I have reviewed the triage vital signs and the nursing notes.  Pertinent labs & imaging results that were available during my care of the patient were reviewed by me and considered in my medical decision making (see chart for details).    Suspect likely bronchitis possibly due to allergic rhinitis.  Will treat with steroid burst and recommended daily allergy med and follow-up if no gradual improvement or with any further concerns.   Final Clinical Impressions(s) / UC Diagnoses   Final diagnoses:  Acute bronchitis, unspecified organism     Discharge Instructions       Please recheck your blood pressure when you are feeling well.    ED Prescriptions     Medication Sig Dispense Auth. Provider   predniSONE (DELTASONE) 20 MG tablet Take 2 tablets (40 mg total) by mouth daily with breakfast for  5 days. 10 tablet Francene Finders, PA-C      PDMP not reviewed this encounter.   Francene Finders, PA-C 06/17/22 1223

## 2022-06-17 NOTE — Discharge Instructions (Signed)
  Please recheck your blood pressure when you are feeling well.

## 2022-06-17 NOTE — ED Triage Notes (Signed)
Pt presents with chest congestion and non productive cough X 1 week with no relief with OTC medication.

## 2022-08-26 ENCOUNTER — Ambulatory Visit: Payer: Medicaid Other | Admitting: Family

## 2022-11-05 ENCOUNTER — Ambulatory Visit: Payer: Medicaid Other | Admitting: Family

## 2022-11-05 ENCOUNTER — Encounter: Payer: Self-pay | Admitting: Family

## 2022-11-05 VITALS — BP 172/89 | HR 98 | Temp 98.2°F | Ht 62.5 in | Wt 188.2 lb

## 2022-11-05 DIAGNOSIS — R030 Elevated blood-pressure reading, without diagnosis of hypertension: Secondary | ICD-10-CM

## 2022-11-05 DIAGNOSIS — N920 Excessive and frequent menstruation with regular cycle: Secondary | ICD-10-CM | POA: Insufficient documentation

## 2022-11-05 DIAGNOSIS — E669 Obesity, unspecified: Secondary | ICD-10-CM | POA: Insufficient documentation

## 2022-11-05 DIAGNOSIS — R87810 Cervical high risk human papillomavirus (HPV) DNA test positive: Secondary | ICD-10-CM | POA: Insufficient documentation

## 2022-11-05 DIAGNOSIS — E6609 Other obesity due to excess calories: Secondary | ICD-10-CM | POA: Diagnosis not present

## 2022-11-05 DIAGNOSIS — E66811 Obesity, class 1: Secondary | ICD-10-CM | POA: Insufficient documentation

## 2022-11-05 DIAGNOSIS — Z0000 Encounter for general adult medical examination without abnormal findings: Secondary | ICD-10-CM

## 2022-11-05 DIAGNOSIS — F419 Anxiety disorder, unspecified: Secondary | ICD-10-CM

## 2022-11-05 DIAGNOSIS — D500 Iron deficiency anemia secondary to blood loss (chronic): Secondary | ICD-10-CM

## 2022-11-05 DIAGNOSIS — D649 Anemia, unspecified: Secondary | ICD-10-CM | POA: Insufficient documentation

## 2022-11-05 DIAGNOSIS — Z8279 Family history of other congenital malformations, deformations and chromosomal abnormalities: Secondary | ICD-10-CM | POA: Insufficient documentation

## 2022-11-05 DIAGNOSIS — Z13220 Encounter for screening for lipoid disorders: Secondary | ICD-10-CM

## 2022-11-05 DIAGNOSIS — Z8632 Personal history of gestational diabetes: Secondary | ICD-10-CM

## 2022-11-05 DIAGNOSIS — Z0001 Encounter for general adult medical examination with abnormal findings: Secondary | ICD-10-CM

## 2022-11-05 DIAGNOSIS — R87610 Atypical squamous cells of undetermined significance on cytologic smear of cervix (ASC-US): Secondary | ICD-10-CM | POA: Insufficient documentation

## 2022-11-05 DIAGNOSIS — Z6833 Body mass index (BMI) 33.0-33.9, adult: Secondary | ICD-10-CM

## 2022-11-05 DIAGNOSIS — F411 Generalized anxiety disorder: Secondary | ICD-10-CM

## 2022-11-05 DIAGNOSIS — O3660X0 Maternal care for excessive fetal growth, unspecified trimester, not applicable or unspecified: Secondary | ICD-10-CM | POA: Insufficient documentation

## 2022-11-05 LAB — BASIC METABOLIC PANEL
BUN: 12 mg/dL (ref 6–23)
CO2: 27 mEq/L (ref 19–32)
Calcium: 9.2 mg/dL (ref 8.4–10.5)
Chloride: 102 mEq/L (ref 96–112)
Creatinine, Ser: 0.57 mg/dL (ref 0.40–1.20)
GFR: 114.11 mL/min (ref >60.00)
Glucose, Bld: 122 mg/dL — ABNORMAL HIGH (ref 70–99)
Potassium: 3.7 mEq/L (ref 3.5–5.1)
Sodium: 139 mEq/L (ref 135–145)

## 2022-11-05 LAB — CBC WITH DIFFERENTIAL/PLATELET
Basophils Absolute: 0.1 10*3/uL (ref 0.0–0.1)
Basophils Relative: 0.6 % (ref 0.0–3.0)
Eosinophils Absolute: 0.2 10*3/uL (ref 0.0–0.7)
Eosinophils Relative: 2 % (ref 0.0–5.0)
HCT: 47.4 % — ABNORMAL HIGH (ref 36.0–46.0)
Hemoglobin: 15.8 g/dL — ABNORMAL HIGH (ref 12.0–15.0)
Lymphocytes Relative: 27.6 % (ref 12.0–46.0)
Lymphs Abs: 3 10*3/uL (ref 0.7–4.0)
MCHC: 33.4 g/dL (ref 30.0–36.0)
MCV: 95.9 fl (ref 78.0–100.0)
Monocytes Absolute: 0.6 10*3/uL (ref 0.1–1.0)
Monocytes Relative: 5.9 % (ref 3.0–12.0)
Neutro Abs: 7 10*3/uL (ref 1.4–7.7)
Neutrophils Relative %: 63.9 % (ref 43.0–77.0)
Platelets: 230 10*3/uL (ref 150.0–400.0)
RBC: 4.94 Mil/uL (ref 3.87–5.11)
RDW: 14 % (ref 11.5–15.5)
WBC: 11 10*3/uL — ABNORMAL HIGH (ref 4.0–10.5)

## 2022-11-05 LAB — LIPID PANEL
Cholesterol: 191 mg/dL (ref 0–200)
HDL: 56.4 mg/dL (ref >39.00)
LDL Cholesterol: 112 mg/dL — ABNORMAL HIGH (ref 0–99)
NonHDL: 134.37
Total CHOL/HDL Ratio: 3
Triglycerides: 112 mg/dL (ref 0.0–149.0)
VLDL: 22.4 mg/dL (ref 0.0–40.0)

## 2022-11-05 LAB — IBC + FERRITIN
Ferritin: 23.7 ng/mL (ref 10.0–291.0)
Iron: 230 ug/dL — ABNORMAL HIGH (ref 42–145)
Saturation Ratios: 49 % (ref 20.0–50.0)
TIBC: 469 ug/dL — ABNORMAL HIGH (ref 250.0–450.0)
Transferrin: 335 mg/dL (ref 212.0–360.0)

## 2022-11-05 MED ORDER — BUPROPION HCL ER (XL) 150 MG PO TB24: 150.0000 mg | ORAL_TABLET | Freq: Every day | ORAL | 0 refills | Status: DC

## 2022-11-05 NOTE — Assessment & Plan Note (Signed)
Pt advised to work on diet and exercise as tolerated

## 2022-11-05 NOTE — Assessment & Plan Note (Addendum)
H/o abn  Referral placed to obgyn as also with h/o ASCUS

## 2022-11-05 NOTE — Assessment & Plan Note (Signed)
Ibuprofen prn prior  Referral for gyn ocp not option as she states does not help her with heavy periods

## 2022-11-05 NOTE — Assessment & Plan Note (Signed)
Will check A1c today pending results

## 2022-11-05 NOTE — Assessment & Plan Note (Signed)
Pt advised of the following:  Continue medication as prescribed. Monitor blood pressure periodically and/or when you feel symptomatic. Goal is <130/90 on average. Ensure that you have rested for 30 minutes prior to checking your blood pressure. Record your readings and bring them to your next visit if necessary.work on a low sodium diet.  

## 2022-11-05 NOTE — Progress Notes (Signed)
Mild elevated cholesterol.  Please work on low cholesterol diet and exercise as tolerated. White blood cell still a bit high but trending down from 42 y/o. Does pt remember in the past why she had wbc or if this was ever evaluated?does she smoke marijuana? We can repeat cbc at f/u visit to see if trending down. Has pt been recently sick?   Iron on higher end, she mentioned she takes iron here and there but is she really taking more frequently? Daily?   Pending hga1c

## 2022-11-05 NOTE — Progress Notes (Signed)
New Patient Office Visit  Subjective:  Patient ID: [name], female    DOB: [birth year]  Age: 42 y.o. MRN: [id number]  CC:  Chief Complaint  Patient presents with   Establish Care    Needs OBGYN referral    HPI [name] is here to establish care as a new patient.  Oriented to practice routines and expectations.  Prior provider was: has not been in over five years.   Pt is without acute concerns.   Pap ascus 2014 Pap 2017 per pt, thinks was normal   chronic concerns:  Elevated BP without htn, did have elevated blood pressure in pregnancy. Denies cp palp or sob. No pedal edema.   IDA: during her cycle heavy periods, she takes iron when she feels fatigue and or IDA Has been on birth control in the past but she states that she would get pregnancy. She states the flow Danielle Dess never really helped with period. This was even with continuous birth control.   Anxiety, states managed with breathing techniques .has had wellbutrin in the past and states was helpful for her. She went off in the past because she switched doctors and they d/c it  ROS: Negative unless specifically indicated above in HPI.   Current Outpatient Medications:    acetaminophen (TYLENOL) 500 MG tablet, Take 1,000 mg by mouth every 6 (six) hours as needed for mild pain, moderate pain or headache. , Disp: , Rfl:    buPROPion (WELLBUTRIN XL) 150 MG 24 hr tablet, Take 1 tablet (150 mg total) by mouth daily., Disp: 90 tablet, Rfl: 0 Past Medical History:  Diagnosis Date   Abnormal Pap smear    Anxiety    Headache(784.0)    otc meds prn - last one 2 wks ago   Pregnancy induced hypertension    Preterm labor    Vaginal Pap smear, abnormal    Past Surgical History:  Procedure Laterality Date   CERVICAL CONIZATION W/BX  12/13/2011   Procedure: CONIZATION CERVIX WITH BIOPSY;  Surgeon: Eldred Manges, MD;  Location: [facility];  Service: Gynecology;  Laterality: N/A;  Cold Knife   CESAREAN SECTION N/A  07/03/2016   Procedure: CESAREAN SECTION;  Surgeon: Waymon Amato, MD;  Location: [place];  Service: Obstetrics;  Laterality: N/A;   CHOLECYSTECTOMY  09/09/2002   COLONOSCOPY     TUBAL LIGATION  2017   UPPER GASTROINTESTINAL ENDOSCOPY     WISDOM TOOTH EXTRACTION      Objective:   Today's Vitals: BP (!) 172/89   Pulse 98   Temp 98.2 F (36.8 C) (Temporal)   Ht 5' 2.5" (1.588 m)   Wt 188 lb 3.2 oz (85.4 kg)   LMP 11/03/2022   SpO2 98%   BMI 33.87 kg/m   Physical Exam Vitals reviewed.  Constitutional:      General: She is not in acute distress.    Appearance: Normal appearance. She is normal weight. She is not ill-appearing.  HENT:     Head: Normocephalic.     Right Ear: Tympanic membrane normal.     Left Ear: Tympanic membrane normal.     Nose: Nose normal.     Mouth/Throat:     Mouth: Mucous membranes are moist.  Eyes:     Extraocular Movements: Extraocular movements intact.     Pupils: Pupils are equal, round, and reactive to light.  Cardiovascular:     Rate and Rhythm: Normal rate and regular rhythm.  Pulmonary:  Effort: Pulmonary effort is normal.     Breath sounds: Normal breath sounds.  Abdominal:     General: Abdomen is flat. Bowel sounds are normal.     Palpations: Abdomen is soft.     Tenderness: There is no guarding or rebound.  Musculoskeletal:        General: Normal range of motion.     Cervical back: Normal range of motion.  Skin:    General: Skin is warm.     Capillary Refill: Capillary refill takes less than 2 seconds.  Neurological:     General: No focal deficit present.     Mental Status: She is alert.  Psychiatric:        Mood and Affect: Mood normal.        Behavior: Behavior normal.        Thought Content: Thought content normal.        Judgment: Judgment normal.     Assessment & Plan:  Class 1 obesity due to excess calories with serious comorbidity and body mass index (BMI) of 33.0 to 33.9 in adult Assessment & Plan: Pt  advised to work on diet and exercise as tolerated   Orders: -     Hemoglobin A1c; Future  Elevated blood pressure reading in office with white coat syndrome, without diagnosis of hypertension Assessment & Plan: Pt advised of the following:  Continue medication as prescribed. Monitor blood pressure periodically and/or when you feel symptomatic. Goal is <130/90 on average. Ensure that you have rested for 30 minutes prior to checking your blood pressure. Record your readings and bring them to your next visit if necessary.work on a low sodium diet.    Atypical squamous cells of undetermined significance on cytologic smear of cervix (ASC-US) Assessment & Plan: H/o abn  Referral placed to obgyn as also with h/o ASCUS   Iron deficiency anemia due to chronic blood loss Assessment & Plan: Checking cbc ibc ferritin today Advised pt to take daily iron supplement  Orders: -     Ambulatory referral to Obstetrics / Gynecology -     CBC with Differential/Platelet -     IBC + Ferritin  Menorrhagia with regular cycle Assessment & Plan: Ibuprofen prn prior  Referral for gyn ocp not option as she states does not help her with heavy periods    Orders: -     Ambulatory referral to Obstetrics / Gynecology  Encounter for general adult medical examination with abnormal findings -     Lipid panel -     CBC with Differential/Platelet -     IBC + Ferritin -     Basic metabolic panel  Anxiety state -     buPROPion HCl ER (XL); Take 1 tablet (150 mg total) by mouth daily.  Dispense: 90 tablet; Refill: 0  Screening for lipoid disorders -     Lipid panel  History of gestational diabetes mellitus Assessment & Plan: Will check A1c today pending results  Orders: -     Hemoglobin A1c; Future  Anxiety Assessment & Plan: Start wellbutrin 150 mg xl  Work on anxiety reducing techniques     Follow-up: Return in about 2 weeks (around 11/19/2022) for f/u blood pressure.   Eugenia Pancoast, FNP

## 2022-11-05 NOTE — Assessment & Plan Note (Signed)
H/o abn  Referral placed to obgyn as also with h/o ASCUS

## 2022-11-05 NOTE — Assessment & Plan Note (Signed)
Start wellbutrin 150 mg xl  Work on anxiety reducing techniques

## 2022-11-05 NOTE — Assessment & Plan Note (Signed)
Checking cbc ibc ferritin today Advised pt to take daily iron supplement

## 2022-11-05 NOTE — Patient Instructions (Addendum)
  Welcome to our clinic, I am happy to have you as my new patient. I am excited to continue on this healthcare journey with you.  Start wellbutrin 150 mg XL once daily.   A referral was placed today for OBGYN  Please let us know if you have not heard back within 2 weeks about the referral.  ------------------------------------  Stop by the lab prior to leaving today. I will notify you of your results once received.   Please keep in mind Any my chart messages you send have up to a three business day turnaround for a response.  Phone calls may take up to a one full business day turnaround for a  response.   If you need a medication refill I recommend you request it through the pharmacy as this is easiest for [place] rather than sending a message and or phone call.   Due to recent changes in healthcare laws, you may see results of your imaging and/or laboratory studies on MyChart before I have had a chance to review them.  I understand that in some cases there may be results that are confusing or concerning to you. Please understand that not all results are received at the same time and often I may need to interpret multiple results in order to provide you with the best plan of care or course of treatment. Therefore, I ask that you please give me 2 business days to thoroughly review all your results before contacting my office for clarification. Should we see a critical lab result, you will be contacted sooner.   It was a pleasure seeing you today! Please do not hesitate to reach out with any questions and or concerns.  Regards,   Eugenia Pancoast FNP-C

## 2022-11-14 ENCOUNTER — Other Ambulatory Visit: Payer: Self-pay | Admitting: Family

## 2022-11-14 DIAGNOSIS — D72829 Elevated white blood cell count, unspecified: Secondary | ICD-10-CM | POA: Insufficient documentation

## 2022-11-14 NOTE — Progress Notes (Signed)
Ok let's have pt schedule LAB ONLY x 2 weeks and repeat levels if still elevated we will likely refer to hematology for evaluation as she does have long history of chronic mild elevation.

## 2022-11-18 ENCOUNTER — Encounter: Payer: Self-pay | Admitting: Family

## 2022-12-02 ENCOUNTER — Other Ambulatory Visit: Payer: Medicaid Other

## 2023-02-21 ENCOUNTER — Ambulatory Visit (INDEPENDENT_AMBULATORY_CARE_PROVIDER_SITE_OTHER): Payer: Medicaid Other | Admitting: Family Medicine

## 2023-02-21 ENCOUNTER — Encounter: Payer: Self-pay | Admitting: Family Medicine

## 2023-02-21 VITALS — BP 189/132 | HR 90 | Ht 62.5 in | Wt 197.0 lb

## 2023-02-21 DIAGNOSIS — R030 Elevated blood-pressure reading, without diagnosis of hypertension: Secondary | ICD-10-CM

## 2023-02-21 DIAGNOSIS — N920 Excessive and frequent menstruation with regular cycle: Secondary | ICD-10-CM | POA: Diagnosis not present

## 2023-02-21 DIAGNOSIS — Z1231 Encounter for screening mammogram for malignant neoplasm of breast: Secondary | ICD-10-CM | POA: Diagnosis not present

## 2023-02-21 NOTE — Progress Notes (Signed)
CC: New GYN  Pt stating that last pap was 2017 and normal per patient, had Abnormal pap with ASCUS in 2014   PCP referred to [place]    Periods normally pretty regular maybe only off by a few days  However periods are very heavy, first 2-3 days are severe per patient   Pt would like to skip pap smear today and come back another visit  Pt stating that she has been placed on birth control with her heavy periods in the past but stating that did not work for her.   Had tubes tied in 2017

## 2023-02-21 NOTE — Progress Notes (Signed)
   GYNECOLOGY ANNUAL PREVENTATIVE CARE ENCOUNTER NOTE  Subjective:   [name] is a 42 y.o. [phone] female here for a routine annual gynecologic exam.  Current complaints: Heavy periods  Tried OCP but does not like hormones She has had an IUD about 14 yr ago with embedded arm. S/p BTL- with confirmed fallopian tissue on pathology    Menses are regular periods every 28-30 days. Reports soaking pads/tampons every hours during days 1-3 of cycle. Cycle length is typically <5 days. Reports heavy cycles since 2017 birth.   Denies abnormal vaginal bleeding, discharge, pelvic pain, problems with intercourse or other gynecologic concerns.    Gynecologic History Patient's last menstrual period was 02/16/2023 (approximate). Contraception: none Last Pap: 2014. Results were: abnormal-- ASCUS, declined pap today Last mammogram: never had--referral made.  Health Maintenance Due  Topic Date Due   Hepatitis C Screening  Never done    The following portions of the patient's history were reviewed and updated as appropriate: allergies, current medications, past family history, past medical history, past social history, past surgical history and problem list.  Review of Systems Pertinent items are noted in HPI.   Objective:  BP (!) 189/132   Pulse 90   Ht 5' 2.5" (1.588 m)   Wt 197 lb (89.4 kg)   LMP 02/16/2023 (Approximate)   BMI 35.46 kg/m  CONSTITUTIONAL: Well-developed, well-nourished female in no acute distress.  HENT:  Normocephalic, atraumatic, External right and left ear normal. Oropharynx is clear and moist EYES:  No scleral icterus.  NECK: Normal range of motion, supple, no masses.  Normal thyroid.  SKIN: Skin is warm and dry. No rash noted. Not diaphoretic. No erythema. No pallor. NEUROLOGIC: Alert and oriented to person, place, and time. Normal reflexes, muscle tone coordination. No cranial nerve deficit noted. PSYCHIATRIC: Normal mood and affect. Normal behavior. Normal  judgment and thought content. CARDIOVASCULAR: Normal heart rate noted, regular rhythm. 2+ distal pulses. RESPIRATORY: Effort and breath sounds normal, no problems with respiration noted. BREASTS: Symmetric in size. No masses, skin changes, nipple drainage, or lymphadenopathy. ABDOMEN: Soft,  no distention noted.  No tenderness, rebound or guarding.  PELVIC: patient declined MUSCULOSKELETAL: Normal range of motion.   Assessment and Plan:  1. Menorrhagia with regular cycle Discussed option for management with COP, Depo, IUD Patient interested in IUD but does not want today and wants to think about it Given handout for mirena/liletta Needs pap, she prefers collection with IUD placement - TSH Rfx on Abnormal to Free T4 - Hemoglobin A1c  2. Elevated BP - Patient reports white coat but given grossly elevated BP I am concerned about HTN - Message sent to NP to have her come for visit. It appears her BP was elevated at their Feb appt 179/89  Return in about 4 weeks (around 03/21/2023) for IUD placement and Pap smear.  Future Appointments  Date Time Provider Department Center  03/24/2023  3:10 PM Anyanwu, Jethro Bastos, MD [facility]    Federico Flake, MD, MPH, ABFM Attending Physician Center for [facility]

## 2023-02-22 LAB — HEMOGLOBIN A1C
Est. average glucose Bld gHb Est-mCnc: 134 mg/dL
Hgb A1c MFr Bld: 6.3 % — ABNORMAL HIGH (ref 4.8–5.6)

## 2023-02-22 LAB — TSH RFX ON ABNORMAL TO FREE T4: TSH: 1.14 u[IU]/mL (ref 0.450–4.500)

## 2023-02-28 ENCOUNTER — Ambulatory Visit
Admission: RE | Admit: 2023-02-28 | Discharge: 2023-02-28 | Disposition: A | Discharge status: Home / Self Care | Payer: Medicaid Other | Source: Ambulatory Visit | Attending: Family Medicine | Admitting: Family Medicine

## 2023-02-28 DIAGNOSIS — Z1231 Encounter for screening mammogram for malignant neoplasm of breast: Secondary | ICD-10-CM

## 2023-03-05 ENCOUNTER — Other Ambulatory Visit: Payer: Self-pay | Admitting: Family Medicine

## 2023-03-05 DIAGNOSIS — R928 Other abnormal and inconclusive findings on diagnostic imaging of breast: Secondary | ICD-10-CM

## 2023-03-11 ENCOUNTER — Ambulatory Visit
Admission: RE | Admit: 2023-03-11 | Discharge: 2023-03-11 | Disposition: A | Discharge status: Home / Self Care | Payer: Medicaid Other | Source: Ambulatory Visit | Attending: Family Medicine | Admitting: Family Medicine

## 2023-03-11 DIAGNOSIS — R928 Other abnormal and inconclusive findings on diagnostic imaging of breast: Secondary | ICD-10-CM

## 2023-03-17 ENCOUNTER — Encounter: Payer: Self-pay | Admitting: Family Medicine

## 2023-03-17 ENCOUNTER — Other Ambulatory Visit: Payer: Self-pay | Admitting: Family Medicine

## 2023-03-17 DIAGNOSIS — N6489 Other specified disorders of breast: Secondary | ICD-10-CM

## 2023-03-24 ENCOUNTER — Ambulatory Visit: Payer: Medicaid Other | Admitting: Obstetrics & Gynecology

## 2023-03-24 ENCOUNTER — Other Ambulatory Visit: Payer: Self-pay | Admitting: Family

## 2023-03-24 ENCOUNTER — Other Ambulatory Visit (HOSPITAL_COMMUNITY)
Admission: RE | Admit: 2023-03-24 | Discharge: 2023-03-24 | Disposition: A | Discharge status: Home / Self Care | Payer: Medicaid Other | Source: Ambulatory Visit | Attending: Obstetrics & Gynecology | Admitting: Obstetrics & Gynecology

## 2023-03-24 VITALS — BP 191/113 | HR 108

## 2023-03-24 DIAGNOSIS — Z124 Encounter for screening for malignant neoplasm of cervix: Secondary | ICD-10-CM

## 2023-03-24 DIAGNOSIS — Z30430 Encounter for insertion of intrauterine contraceptive device: Secondary | ICD-10-CM

## 2023-03-24 DIAGNOSIS — R030 Elevated blood-pressure reading, without diagnosis of hypertension: Secondary | ICD-10-CM

## 2023-03-24 DIAGNOSIS — I10 Essential (primary) hypertension: Secondary | ICD-10-CM

## 2023-03-24 MED ORDER — AMLODIPINE BESYLATE 5 MG PO TABS
5.0000 mg | ORAL_TABLET | Freq: Every day | ORAL | 0 refills | Status: DC
Start: 2023-03-24 — End: 2023-04-15

## 2023-03-24 MED ORDER — LEVONORGESTREL 20 MCG/DAY IU IUD
1.0000 | INTRAUTERINE_SYSTEM | Freq: Once | INTRAUTERINE | Status: AC
Start: 2023-03-24 — End: 2023-03-24
  Administered 2023-03-24: 1 via INTRAUTERINE

## 2023-03-24 NOTE — Patient Instructions (Signed)
Intrauterine Device Insertion An intrauterine device (IUD) is a medical device that is inserted into the uterus to prevent pregnancy. It is a small, T-shaped device that has one or two nylon strings hanging down from it. The strings hang out of the lower part of the uterus (cervix) to allow for future IUD removal. There are two types of IUDs: Hormone IUD. This type of IUD is made of plastic and contains the hormone progestin (synthetic progesterone). A hormone IUD may last 3-5 years, depending on which one you have. Synthetic progesterone prevents pregnancy by: Thickening cervical mucus to prevent sperm from entering the uterus. Thinning the uterine lining to prevent a fertilized egg from implanting there. Copper IUD. This type of IUD has copper wire wrapped around it. A copper IUD may last up to 10 years. Copper prevents pregnancy by making the uterus and fallopian tubes produce a fluid that kills sperm. Tell a health care provider about: Any allergies you have. All medicines you are taking, including vitamins, herbs, eye drops, creams, and over-the-counter medicines. Any surgeries you have had. Any medical conditions you have, including any sexually transmitted infections (STIs) you may have. Whether you are pregnant or may be pregnant. What are the risks? Generally, this is a safe procedure. However, problems may occur, including: Infection. Bleeding. Allergic reactions to medicines. Puncture (perforation) of the uterus or damage to other structures or organs. Accidental placement of the IUD either in the muscle layer of the uterus (myometrium) or outside the uterus. The IUD falling out of the uterus (expulsion). This is more common among women who have recently had a child. Higher risk of an egg being fertilized outside your uterus (ectopic pregnancy).This is rare. Pelvic inflammatory disease (PID), which is an infection in the uterus and fallopian tubes. The IUD does not cause the  infection. The infection is usually from an unknown sexually transmitted infection (STI). This is rare, and it usually happens during the first 20 days after the IUD is inserted. What happens before the procedure? Ask your health care provider about: Changing or stopping your regular medicines. This is especially important if you are taking diabetes medicines or blood thinners. Taking over-the-counter medicines, vitamins, herbs, and supplements. Talk with your health care provider about when to schedule your IUD placement. Your health care provider may recommend taking over-the-counter pain medicines before the procedure. These medicines include ibuprofen and naproxen. You may have tests for: Pregnancy. A pregnancy test involves having a urine or blood sample taken. Sexually transmitted infections (STIs). Placing an IUD in someone who has an STI can make the infection worse. Cervical cancer. You may have a Pap test to check for this type of cancer. This means collecting cells from your cervix to be checked under a microscope. You may have a physical exam to determine the size and position of your uterus. What happens during the procedure? A tool (speculum) will be placed in your vagina and widened so that your health care provider can see your cervix. Medicine, or antiseptic, may be applied to your cervix to help lower your risk of infection. You may be given an anesthetic medicine to numb each side of your cervix. This medicine is usually given by an injection into the cervix. A tool called a uterine sound will be inserted into your uterus to check the length of your uterus and the direction that your uterus may be tilted. A slim instrument or tube (IUD inserter) that holds the IUD will be inserted into your vagina,  through your cervical canal, and into your uterus. The IUD will be placed in the uterus, and the IUD inserter will be removed. The strings that are attached to the IUD will be trimmed  so that they lie just below the cervix. The speculum will be removed. The procedure may vary among health care providers and hospitals. What can I expect after procedure? You may have bleeding after the procedure. This is normal. It varies from light bleeding (spotting) for a few days to menstrual-like bleeding. You may have cramping and pain in the abdomen. You may feel dizzy or light-headed. You may have lower back pain. You may have headaches and nausea. Follow these instructions at home: Before resuming sexual activity, check to make sure that you can feel the IUD string or strings. You should be able to feel the end of the string below the opening of your cervix. If your IUD string is in place, you may resume sexual activity. If you had a hormonal IUD inserted more than 7 days after your most recent period started, you will need to use a backup method of birth control for 7 days after IUD insertion. Ask your health care provider whether this applies to you. Continue to check that the IUD is still in place by feeling for the strings after every menstrual period, or once a month. An IUD will not protect you from sexually transmitted infections (STIs). Use methods to prevent the exchange of body fluids between partners (barrier protection) every time you have sex. Barrier protection can be used during oral, vaginal, or anal sex. Commonly used barrier methods include: Female condom. Female condom. Dental dam. Take over-the-counter and prescription medicines only as told by your health care provider. Keep all follow-up visits. This is important. Contact a health care provider if: You feel light-headed or weak. You have any of the following problems with your IUD string or strings: The string bothers or hurts you or your sexual partner. You cannot feel the string. The string has gotten longer. You can feel the IUD in your vagina. You think you may be pregnant, or you miss your menstrual  period. You think you may have a sexually transmitted infection (STI). Get help right away if you: You have flu-like symptoms, such as tiredness (fatigue) and muscle aches. You have a fever and chills. You have bleeding that is heavier or lasts longer than a normal menstrual cycle. You have abnormal or bad-smelling discharge from your vagina. You develop abdominal pain that is new, is getting worse, or is not in the same area of earlier cramping and pain. You have pain during sexual activity. Summary An intrauterine device (IUD) is a small, T-shaped device that has one or two nylon strings hanging down from it. You may have a copper IUD or a hormone IUD. Ask your health care provider what you need to do before the procedure. You may have some tests and you may have to change or stop some medicines. You may have bleeding after the procedure. This is normal. It varies from light spotting for a few days to menstrual-like bleeding. Check to make sure that you can feel the IUD strings before you resume sexual activity. Check the strings after every menstrual period or once a month. An IUD does not protect against STIs. Use other methods to protect yourself against infections. This information is not intended to replace advice given to you by your health care provider. Make sure you discuss any questions you have with  your health care provider. Document Revised: 03/08/2020 Document Reviewed: 03/08/2020 Elsevier Patient Education  2024 [employer].

## 2023-03-24 NOTE — Progress Notes (Signed)
    GYNECOLOGY OFFICE PROCEDURE NOTE  [name] is a 42 y.o. [phone] here for Mirena IUD insertion for bleeding control. Also needs pap smear today  IUD Insertion Procedure Note Patient identified, informed consent performed, consent signed.   Discussed risks of irregular bleeding, cramping, infection, malpositioning or misplacement of the IUD outside the uterus which may require further procedure such as laparoscopy. Also discussed >99% contraception efficacy, increased risk of ectopic pregnancy with failure of method. Of note, she already had a BTS for contraception.  Time out was performed.  Chaperone present.  Urine pregnancy test negative.  Speculum placed in the vagina.  Cervix visualized.  There was a 2 cm ?Nabothian cyst noted noted on left side of the anterior lip.  Pap smear done. Cervix cleaned with Betadine x 2.  Grasped anteriorly with a single tooth tenaculum.  Uterus sounded to 10 cm.  Mirena IUD placed per manufacturer's recommendations.  Strings trimmed to 3 cm. Tenaculum was removed, good hemostasis noted.  Patient tolerated procedure well.  Bedside ultrasound done afterwards confirmed correct intrauterine placement.  Patient was given post-procedure instructions.  Patient was also asked to follow up in 4 weeks for IUD check. Will follow up pap results and manage accordingly.   Of note, significantly elevated BP noted, she checks BP a home and reports it is 120s/80s at home.  Vitals:   03/24/23 1512  BP: (!) 191/113  This was similar to her last visit here.  Message sent to her PCP Mort Sawyers, FNP) for follow up.    Jaynie Collins, MD, FACOG Obstetrician & Gynecologist, [facility] for [employer], [facility]

## 2023-03-24 NOTE — Progress Notes (Unsigned)
Pt GYN office reached out due to elevated blood pressures. I do agree at this time we need to start medication. I have sent in amlodipine to get started, take one tablet once daily and follow up in office in one week. Pt was supposed to follow up back in March. I highly suggest she start because she is at [facility] elevated with current blood pressure. Make sure pt comes in with blood pressure log for follow up

## 2023-03-25 NOTE — Progress Notes (Signed)
PC to pt.  She is aware of new BP medication sent in to [facility].  She will take BP at home and bring in log to appointment on 04/02/23.

## 2023-03-27 LAB — CYTOLOGY - PAP
Comment: NEGATIVE
Diagnosis: NEGATIVE
High risk HPV: NEGATIVE

## 2023-04-02 ENCOUNTER — Ambulatory Visit: Payer: Medicaid Other | Admitting: Family

## 2023-04-15 ENCOUNTER — Encounter: Payer: Self-pay | Admitting: Family

## 2023-04-15 ENCOUNTER — Ambulatory Visit: Payer: Medicaid Other | Admitting: Family

## 2023-04-15 VITALS — BP 145/115 | HR 104 | Temp 98.2°F | Ht 62.5 in | Wt 205.0 lb

## 2023-04-15 DIAGNOSIS — E6609 Other obesity due to excess calories: Secondary | ICD-10-CM

## 2023-04-15 DIAGNOSIS — F419 Anxiety disorder, unspecified: Secondary | ICD-10-CM | POA: Diagnosis not present

## 2023-04-15 DIAGNOSIS — I10 Essential (primary) hypertension: Secondary | ICD-10-CM

## 2023-04-15 DIAGNOSIS — Z6833 Body mass index (BMI) 33.0-33.9, adult: Secondary | ICD-10-CM

## 2023-04-15 DIAGNOSIS — Z975 Presence of (intrauterine) contraceptive device: Secondary | ICD-10-CM

## 2023-04-15 DIAGNOSIS — N920 Excessive and frequent menstruation with regular cycle: Secondary | ICD-10-CM | POA: Diagnosis not present

## 2023-04-15 LAB — MICROALBUMIN / CREATININE URINE RATIO
Creatinine,U: 38.4 mg/dL
Microalb Creat Ratio: 21.1 mg/g (ref 0.0–30.0)
Microalb, Ur: 8.1 mg/dL — ABNORMAL HIGH (ref 0.0–1.9)

## 2023-04-15 MED ORDER — ESCITALOPRAM OXALATE 10 MG PO TABS
10.0000 mg | ORAL_TABLET | Freq: Every day | ORAL | 3 refills | Status: DC
Start: 2023-04-15 — End: 2023-12-29

## 2023-04-15 MED ORDER — AMLODIPINE BESYLATE 10 MG PO TABS
10.0000 mg | ORAL_TABLET | Freq: Every day | ORAL | 0 refills | Status: DC
Start: 2023-04-15 — End: 2023-12-29

## 2023-04-15 NOTE — Assessment & Plan Note (Signed)
Will repeat ibc ferritin at next f/u pt no longer taking iron supplementation.

## 2023-04-15 NOTE — Assessment & Plan Note (Signed)
Ongoing.  Trial lexapro, continue f/u with therapy as scheduled.  I instructed pt to start lexapro 1/2 tablet once daily for 1 week and then increase to a full tablet once daily on week two as tolerated.  We discussed common side effects such as nausea, drowsiness and weight gain.  Also discussed rare but serious side effect of suicidal ideation.  She is instructed to discontinue medication and go directly to ED if this occurs.  Pt verbalizes understanding.  Plan is to follow up in 30 days to evaluate progress.

## 2023-04-15 NOTE — Patient Instructions (Addendum)
------------------------------------    Start lexapro 10 mg for anxiety and depression. Take 1/2 tablet by mouth once daily for about one week, then increase to 1 full tablet thereafter.   Taking the medicine as directed and not missing any doses is one of the best things you can do to treat your anxiety/depression.  Here are some things to keep in mind:  Side effects (stomach upset, some increased anxiety) may happen before you notice a benefit.  These side effects typically go away over time. Changes to your dose of medicine or a change in medication all together is sometimes necessary Many people will notice an improvement within two weeks but the full effect of the medication can take up to 4-6 weeks Stopping the medication when you start feeling better often results in a return of symptoms. Most people need to be on medication at least 6-12 months If you start having thoughts of hurting yourself or others after starting this medicine, please call me immediately.    ------------------------------------   Increase amlodipine to 10 mg once daily.    Regards,   Mort Sawyers FNP-C

## 2023-04-15 NOTE — Assessment & Plan Note (Signed)
Pt advised to work on diet and exercise as tolerated  
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
## 2023-04-15 NOTE — Assessment & Plan Note (Signed)
Increase amlodipine to 10 mg once daily  Advised pt to check blood pressure once daily. Will send message to f/u with patient in one week.  Did advise pt importance of following up as scheduled for goal of getting blood pressure <130/90. Did advise pt of red flag symptoms to watch out for.

## 2023-04-16 ENCOUNTER — Other Ambulatory Visit: Payer: Self-pay | Admitting: Family

## 2023-04-16 DIAGNOSIS — R809 Proteinuria, unspecified: Secondary | ICD-10-CM

## 2023-04-16 DIAGNOSIS — I10 Essential (primary) hypertension: Secondary | ICD-10-CM

## 2023-04-16 MED ORDER — LOSARTAN POTASSIUM 50 MG PO TABS
50.0000 mg | ORAL_TABLET | Freq: Every day | ORAL | 0 refills | Status: DC
Start: 2023-04-16 — End: 2023-12-29

## 2023-04-16 NOTE — Progress Notes (Signed)
Positive urine microalbumin.t his means that the kidneys have been affected, and have increased stress due to having higher than normal blood pressure for some time. Adding on another agent losartan 50 mg once daily along with amlodipine 10 mg once daily. Continue both. The losartan will further protect the kidneys moving forward.

## 2023-04-21 NOTE — Progress Notes (Unsigned)
   GYNECOLOGY OFFICE VISIT NOTE  History:   [name] is a 41 y.o. [phone] here today for string check. She had an IUD inserted on 7/15 with Dr. Macon Large.    She has had spotting but nothing like how her periods were before. She is happy with the IUD thus far. Prior IUD, she had malpositioning and had to have it removed.    The following portions of the patient's history were reviewed and updated as appropriate: allergies, current medications, past family history, past medical history, past social history, past surgical history and problem list.   Health Maintenance:   Diagnosis  Date Value Ref Range Status  03/24/2023   Final   - Negative for intraepithelial lesion or malignancy (NILM)   Review of Systems:  Pertinent items noted in HPI and remainder of comprehensive ROS otherwise negative.  Physical Exam:  BP (!) 152/93   Pulse (!) 103   Wt 203 lb (92.1 kg)   BMI 36.54 kg/m  CONSTITUTIONAL: Well-developed, well-nourished female in no acute distress.  HEENT:  Normocephalic, atraumatic. External right and left ear normal. No scleral icterus.  NECK: Normal range of motion, supple, no masses noted on observation SKIN: No rash noted. Not diaphoretic. No erythema. No pallor. MUSCULOSKELETAL: Normal range of motion. No edema noted. NEUROLOGIC: Alert and oriented to person, place, and time. Normal muscle tone coordination. No cranial nerve deficit noted. PSYCHIATRIC: Normal mood and affect. Normal behavior. Normal judgment and thought content.  PELVIC:  IUD strings visualized    Assessment and Plan:   1. IUD (intrauterine device) in place Reviewed symptoms of expulsion and malposition.  Strings in place.     No orders of the defined types were placed in this encounter.    Routine preventative health maintenance measures emphasized. Please refer to After Visit Summary for other counseling recommendations.   Return if symptoms worsen or fail to improve.  Milas Hock,  MD, FACOG Obstetrician & Gynecologist, [facility] for [facility], [facility]

## 2023-04-22 ENCOUNTER — Encounter: Payer: Self-pay | Admitting: Obstetrics and Gynecology

## 2023-04-22 ENCOUNTER — Ambulatory Visit: Payer: Medicaid Other | Admitting: Obstetrics and Gynecology

## 2023-04-22 VITALS — BP 152/93 | HR 103 | Wt 203.0 lb

## 2023-04-22 DIAGNOSIS — Z30431 Encounter for routine checking of intrauterine contraceptive device: Secondary | ICD-10-CM | POA: Diagnosis not present

## 2023-04-22 DIAGNOSIS — Z975 Presence of (intrauterine) contraceptive device: Secondary | ICD-10-CM

## 2023-04-22 NOTE — Progress Notes (Signed)
CC: IUD check  Having some spotting x 10 days

## 2023-09-26 ENCOUNTER — Other Ambulatory Visit: Payer: Self-pay | Admitting: Family Medicine

## 2023-09-26 ENCOUNTER — Ambulatory Visit
Admission: RE | Admit: 2023-09-26 | Discharge: 2023-09-26 | Disposition: A | Discharge status: Home / Self Care | Payer: Medicaid Other | Source: Ambulatory Visit | Attending: Family Medicine | Admitting: Family Medicine

## 2023-09-26 DIAGNOSIS — N6489 Other specified disorders of breast: Secondary | ICD-10-CM

## 2023-11-24 ENCOUNTER — Other Ambulatory Visit: Payer: Self-pay | Admitting: Family

## 2023-11-24 ENCOUNTER — Encounter: Payer: Self-pay | Admitting: Family

## 2023-11-24 DIAGNOSIS — I10 Essential (primary) hypertension: Secondary | ICD-10-CM

## 2023-12-29 ENCOUNTER — Ambulatory Visit
Admission: EM | Admit: 2023-12-29 | Discharge: 2023-12-29 | Disposition: A | Discharge status: Home / Self Care | Attending: Nurse Practitioner | Admitting: Nurse Practitioner

## 2023-12-29 DIAGNOSIS — N3001 Acute cystitis with hematuria: Secondary | ICD-10-CM | POA: Insufficient documentation

## 2023-12-29 DIAGNOSIS — R3915 Urgency of urination: Secondary | ICD-10-CM | POA: Insufficient documentation

## 2023-12-29 DIAGNOSIS — R300 Dysuria: Secondary | ICD-10-CM | POA: Insufficient documentation

## 2023-12-29 LAB — POCT URINALYSIS DIP (MANUAL ENTRY)
Bilirubin, UA: NEGATIVE
Glucose, UA: NEGATIVE mg/dL
Ketones, POC UA: NEGATIVE mg/dL
Nitrite, UA: NEGATIVE
Protein Ur, POC: NEGATIVE mg/dL
Spec Grav, UA: <=1.005 — AB
Urobilinogen, UA: 0.2 U/dL
pH, UA: 6

## 2023-12-29 LAB — POCT URINE PREGNANCY: Preg Test, Ur: NEGATIVE

## 2023-12-29 MED ORDER — URIBEL 118 MG PO CAPS: 1.0000 | ORAL_CAPSULE | Freq: Four times a day (QID) | ORAL | 0 refills | Status: AC

## 2023-12-29 MED ORDER — PHENAZOPYRIDINE HCL 200 MG PO TABS: 200.0000 mg | ORAL_TABLET | Freq: Three times a day (TID) | ORAL | 0 refills | Status: DC

## 2023-12-29 MED ORDER — NITROFURANTOIN MONOHYD MACRO 100 MG PO CAPS: 100.0000 mg | ORAL_CAPSULE | Freq: Two times a day (BID) | ORAL | 0 refills | Status: DC

## 2023-12-29 NOTE — Discharge Instructions (Signed)
 You have been diagnosed with a urinary tract infection (UTI). It is important that you take all of your medications exactly as prescribed, even if you start to feel better before the medication is finished. Make sure to drink plenty of fluids throughout the day. This helps flush out your urinary system and keeps your urine clear or light yellow, which is a sign of good hydration. A sample of your urine has been sent to the lab for a culture. This test will check if your infection is caused by bacteria and will confirm that the antibiotic you were given is the right one for treatment. While waiting for the results, continue taking the medication as directed. You should begin to feel better within the next couple of days. If your symptoms do not improve or if they get worse, please follow up with your primary care provider for further evaluation and possible changes to your treatment.

## 2023-12-29 NOTE — ED Provider Notes (Signed)
 [facility]    CSN: [id number] Arrival date & time: 12/29/23  0804      History   Chief Complaint Chief Complaint  Patient presents with   UTI Symptoms    HPI [name] is a 43 y.o. female.   [name] is a 43 year old female who presents with concerns of a urinary tract infection (UTI). She reports the onset of symptoms 1 day ago, which have gradually worsened. Her symptoms include dysuria, urinary urgency, low back pain, and suprapubic discomfort. She denies any foul-smelling urine, vaginal irritation, vaginal itching, or vaginal discharge. Additionally, she has not experienced fever, nausea, or vomiting. Her last menstrual period was on 12/15/2023. She has an IUD in place and is sexually active with one female partner, with whom she has been monogamous for the past 4 years. She uses condoms consistently and denies any concerns regarding sexually transmitted diseases.  The following portions of the patient's history were reviewed and updated as appropriate: allergies, current medications, past family history, past medical history, past social history, past surgical history, and problem list.      Past Medical History:  Diagnosis Date   Abnormal Pap smear    Anxiety    Headache(784.0)    otc meds prn - last one 2 wks ago   Pregnancy induced hypertension    Preterm labor    Vaginal Pap smear, abnormal     Patient Active Problem List   Diagnosis Date Noted   Positive for microalbuminuria 04/16/2023   IUD (intrauterine device) in place 04/15/2023   Primary hypertension 03/24/2023   Leukocytosis 11/14/2022   Increased storage iron 11/14/2022   Atypical squamous cells of undetermined significance on cytologic smear of cervix (ASC-US ) 11/05/2022   Anemia 11/05/2022   Class 1 obesity due to excess calories with serious comorbidity and body mass index (BMI) of 33.0 to 33.9 in adult 11/05/2022   Menorrhagia with regular cycle 11/05/2022   History of  gestational diabetes mellitus 12/28/2015   Anxiety 12/13/2011    Past Surgical History:  Procedure Laterality Date   CERVICAL CONIZATION W/BX  12/13/2011   Procedure: CONIZATION CERVIX WITH BIOPSY;  Surgeon: Stevenson Elbe, MD;  Location: [facility];  Service: Gynecology;  Laterality: N/A;  Cold Knife   CESAREAN SECTION N/A 07/03/2016   Procedure: CESAREAN SECTION;  Surgeon: Vernal Gold, MD;  Location: [facility];  Service: Obstetrics;  Laterality: N/A;   CHOLECYSTECTOMY  09/09/2002   COLONOSCOPY     TUBAL LIGATION  2017   UPPER GASTROINTESTINAL ENDOSCOPY     WISDOM TOOTH EXTRACTION      OB History     Gravida  3   Para  3   Term  2   Preterm  1   AB      Living  3      SAB      IAB      Ectopic      Multiple  0   Live Births  3            Home Medications    Prior to Admission medications   Medication Sig Start Date End Date Taking? Authorizing Provider  Meth-Hyo-M Bl-Na Phos-Ph Sal (URIBEL ) 118 MG CAPS Take 1 capsule (118 mg total) by mouth in the morning, at noon, in the evening, and at bedtime for 5 days. 12/29/23 01/03/24 Yes Cloyce Blankenhorn, Ova Bloomer, FNP  nitrofurantoin , macrocrystal-monohydrate, (MACROBID ) 100 MG capsule Take 1 capsule (100 mg total) by mouth 2 (  two) times daily. 12/29/23  Yes Johnica Armwood, FNP  phenazopyridine  (PYRIDIUM ) 200 MG tablet Take 1 tablet (200 mg total) by mouth 3 (three) times daily. 12/29/23  Yes Maryruth Sol, FNP  levonorgestrel  (MIRENA ) 20 MCG/DAY IUD 1 each by Intrauterine route once.    [provider]    Family History Family History  Problem Relation Age of Onset   Diabetes Mother        controlled   Healthy Father    Diabetes Maternal Grandfather    Hypertension Maternal Grandfather    Diabetes Maternal Aunt     Social History Social History   Tobacco Use   Smoking status: Former    Current packs/day: 0.50    Average packs/day: 0.5 packs/day for 3.0 years (1.5 ttl pk-yrs)    Types:  Cigarettes   Smokeless tobacco: Never   Tobacco comments:    quit 2005  Vaping Use   Vaping status: Never Used  Substance Use Topics   Alcohol use: No    Comment: social/ none during pregnacy   Drug use: No     Allergies   Sulfa antibiotics, Wellbutrin  [bupropion ], and Amoxicillin   Review of Systems Review of Systems  Constitutional:  Negative for fever.  Respiratory:  Negative for shortness of breath.   Cardiovascular:  Negative for chest pain, palpitations and leg swelling.  Genitourinary:  Positive for dysuria, frequency and urgency. Negative for genital sores and vaginal discharge.  Musculoskeletal:  Positive for back pain.  All other systems reviewed and are negative.    Physical Exam Triage Vital Signs ED Triage Vitals  Encounter Vitals Group     BP 12/29/23 0824 (!) 153/97     Systolic BP Percentile --      Diastolic BP Percentile --      Pulse Rate 12/29/23 0824 (!) 119     Resp 12/29/23 0824 20     Temp 12/29/23 0824 98.3 F (36.8 C)     Temp Source 12/29/23 0824 Oral     SpO2 12/29/23 0824 96 %     Weight 12/29/23 0821 172 lb (78 kg)     Height 12/29/23 0821 5\' 2"  (1.575 m)     Head Circumference --      Peak Flow --      Pain Score 12/29/23 0817 0     Pain Loc --      Pain Education --      Exclude from Growth Chart --    No data found.  Updated Vital Signs BP (!) 153/97 (BP Location: Left Arm) Comment: "I am in pain, on/off"  Pulse (!) 119 Comment: "I am in pain, on/off"  Temp 98.3 F (36.8 C) (Oral)   Resp 20   Ht 5\' 2"  (1.575 m)   Wt 172 lb (78 kg)   LMP  (LMP Unknown)   SpO2 96%   BMI 31.46 kg/m   Visual Acuity Right Eye Distance:   Left Eye Distance:   Bilateral Distance:    Right Eye Near:   Left Eye Near:    Bilateral Near:     Physical Exam Constitutional:      General: She is not in acute distress.    Appearance: Normal appearance. She is not ill-appearing, toxic-appearing or diaphoretic.  HENT:     Head:  Normocephalic.     Nose: Nose normal.     Mouth/Throat:     Mouth: Mucous membranes are moist.  Eyes:     Conjunctiva/sclera: Conjunctivae normal.  Cardiovascular:     Rate and Rhythm: Regular rhythm. Tachycardia present.     Heart sounds: Normal heart sounds.  Pulmonary:     Effort: Pulmonary effort is normal.  Abdominal:     Palpations: Abdomen is soft.  Musculoskeletal:        General: Normal range of motion.     Cervical back: Normal range of motion and neck supple.     Right lower leg: No edema.     Left lower leg: No edema.  Skin:    General: Skin is warm and dry.  Neurological:     General: No focal deficit present.     Mental Status: She is alert and oriented to person, place, and time.  Psychiatric:        Mood and Affect: Mood normal.        Behavior: Behavior normal.      UC Treatments / Results  Labs (all labs ordered are listed, but only abnormal results are displayed) Labs Reviewed  POCT URINALYSIS DIP (MANUAL ENTRY) - Abnormal; Notable for the following components:      Result Value   Spec Grav, UA <=1.005 (*)    Blood, UA moderate (*)    Leukocytes, UA Small (1+) (*)    All other components within normal limits  POCT URINE PREGNANCY - Normal  URINE CULTURE    EKG   Radiology No results found.  Procedures Procedures (including critical care time)  Medications Ordered in UC Medications - No data to display  Initial Impression / Assessment and Plan / UC Course  I have reviewed the triage vital signs and the nursing notes.  Pertinent labs & imaging results that were available during my care of the patient were reviewed by me and considered in my medical decision making (see chart for details).    43 year old female presenting with dysuria, urgency, low back pain, and suprapubic discomfort, which began 1 day ago. She denies any fevers or vaginal discharge. The patient is afebrile and nontoxic. Physical examination was as noted. A GU exam was  deferred. Urine pregnancy test was negative. Urinalysis revealed small leukocytes and moderate blood. A urine culture has been sent for further analysis. The patient was prescribed Macrobid , Pyridium , and Uribel . Supportive care measures were discussed, and the indications for follow-up were reviewed with the patient.  Today's evaluation has revealed no signs of a dangerous process. Discussed diagnosis with patient and/or guardian. Patient and/or guardian aware of their diagnosis, possible red flag symptoms to watch out for and need for close follow up. Patient and/or guardian understands verbal and written discharge instructions. Patient and/or guardian comfortable with plan and disposition.  Patient and/or guardian has a clear mental status at this time, good insight into illness (after discussion and teaching) and has clear judgment to make decisions regarding their care  Documentation was completed with the aid of voice recognition software. Transcription may contain typographical errors. Final Clinical Impressions(s) / UC Diagnoses   Final diagnoses:  Urinary urgency  Dysuria  Acute cystitis with hematuria     Discharge Instructions      You have been diagnosed with a urinary tract infection (UTI). It is important that you take all of your medications exactly as prescribed, even if you start to feel better before the medication is finished. Make sure to drink plenty of fluids throughout the day. This helps flush out your urinary system and keeps your urine clear or light yellow, which is a sign of good hydration.  A sample of your urine has been sent to the lab for a culture. This test will check if your infection is caused by bacteria and will confirm that the antibiotic you were given is the right one for treatment. While waiting for the results, continue taking the medication as directed. You should begin to feel better within the next couple of days. If your symptoms do not improve or if  they get worse, please follow up with your primary care provider for further evaluation and possible changes to your treatment.       ED Prescriptions     Medication Sig Dispense Auth. Provider   nitrofurantoin , macrocrystal-monohydrate, (MACROBID ) 100 MG capsule Take 1 capsule (100 mg total) by mouth 2 (two) times daily. 10 capsule Maryruth Sol, FNP   phenazopyridine  (PYRIDIUM ) 200 MG tablet Take 1 tablet (200 mg total) by mouth 3 (three) times daily. 6 tablet Maryruth Sol, FNP   Meth-Hyo-M Bl-Na Phos-Ph Sal (URIBEL ) 118 MG CAPS Take 1 capsule (118 mg total) by mouth in the morning, at noon, in the evening, and at bedtime for 5 days. 20 capsule Maryruth Sol, FNP      PDMP not reviewed this encounter.   Beola Brazil [place], [place] 12/29/23 [phone]

## 2023-12-29 NOTE — ED Triage Notes (Signed)
"  Yesterday I noticed in waking up it was very uncomfortable with peeing and now this morning actual pain with urination". No fever. No nausea. No vomiting.

## 2023-12-31 LAB — URINE CULTURE: Culture: 20000 — AB

## 2024-01-06 ENCOUNTER — Telehealth (HOSPITAL_COMMUNITY): Payer: Self-pay

## 2024-01-06 NOTE — Telephone Encounter (Signed)
 Pt calls stating she has finished Macrobid , but has been experiencing continued symptoms, along with back cramping. Advised to return to [facility] for re-evaluation. Verbalized understanding.

## 2024-02-09 ENCOUNTER — Telehealth: Payer: Self-pay

## 2024-02-09 NOTE — Telephone Encounter (Signed)
 Left message for pt to call office back regarding requested appointment through answering service.

## 2024-06-21 ENCOUNTER — Ambulatory Visit
Admission: EM | Admit: 2024-06-21 | Discharge: 2024-06-21 | Disposition: A | Discharge status: Home / Self Care | Attending: Nurse Practitioner | Admitting: Nurse Practitioner

## 2024-06-21 ENCOUNTER — Encounter: Payer: Self-pay | Admitting: Emergency Medicine

## 2024-06-21 DIAGNOSIS — J069 Acute upper respiratory infection, unspecified: Secondary | ICD-10-CM | POA: Diagnosis not present

## 2024-06-21 MED ORDER — PSEUDOEPH-BROMPHEN-DM 30-2-10 MG/5ML PO SYRP: 10.0000 mL | ORAL_SOLUTION | Freq: Four times a day (QID) | ORAL | 0 refills | Status: DC | PRN

## 2024-06-21 MED ORDER — CETIRIZINE-PSEUDOEPHEDRINE ER 5-120 MG PO TB12: 1.0000 | ORAL_TABLET | Freq: Every day | ORAL | 0 refills | Status: AC

## 2024-06-21 MED ORDER — MUCINEX DM MAXIMUM STRENGTH 60-1200 MG PO TB12: 1.0000 | ORAL_TABLET | Freq: Two times a day (BID) | ORAL | 0 refills | Status: DC

## 2024-06-21 NOTE — Discharge Instructions (Addendum)
 Your symptoms are most likely due to a respiratory infection affecting your nose, throat, or lungs. These infections are usually caused by a virus, which means antibiotics will not help since they only treat bacterial infections. Please take any medications prescribed to you as directed. Several over-the-counter medicines can make you more comfortable. Acetaminophen  (Tylenol ) or ibuprofen  (Advil , Motrin ) can help with fever, body aches, or sore throat pain. Nasal sprays like Flonase may relieve nasal congestion, while saline sprays or rinses can be used often to keep your nose clear. Sore throat discomfort may improve with lozenges, menthol  or benzocaine sprays, or warm saltwater gargles. These medicines do not cure the virus but can help you feel better as your body recovers. It is important to drink plenty of fluids to stay hydrated and help thin mucus. Aim for urine that is pale yellow. Using a cool mist humidifier at home, inhaling steam several times a day, and avoiding cool or dry air may also ease congestion. Sleeping with your head elevated can reduce post-nasal drainage, and getting enough rest each night supports recovery. Remember to replace your toothbrush once you start feeling better. A cough may last for several weeks after a respiratory illness even when other symptoms resolve, as the airways remain irritated. As long as the cough gradually improves and no new concerning symptoms appear, this is part of normal healing.  If your symptoms worsen or you develop new problems such as trouble breathing, chest pain, or high fever, go to the emergency room right away.

## 2024-06-21 NOTE — ED Provider Notes (Signed)
 [facility]    CSN: [id number] Arrival date & time: 06/21/24  0806      History   Chief Complaint Chief Complaint  Patient presents with   Cough    HPI [name] is a 43 y.o. female.   Discussed the use of AI scribe software for clinical note transcription with the patient, who gave verbal consent to proceed.   The patient presents with a 4-day history of upper respiratory symptoms that began with sneezing, followed by the onset of cough and nasal congestion, which have progressively worsened. She denies sore throat but reports a raw sensation when coughing. She notes post-nasal drainage, particularly at night.  Yesterday, she experienced a headache that she attributes to coughing, which improved as the coughing lessened. She denies rhinorrhea, fever, chills, body aches, wheezing, shortness of breath, nausea, vomiting, or diarrhea. There are no known sick contacts.  Her current medications include only vitamins. She denies tobacco or vape use. Allergies include sulfa, amoxicillin, and Wellbutrin .  The following sections of the patient's history were reviewed and updated as appropriate: allergies, current medications, past family history, past medical history, past social history, past surgical history, and problem list.       Past Medical History:  Diagnosis Date   Abnormal Pap smear    Anxiety    Headache(784.0)    otc meds prn - last one 2 wks ago   Pregnancy induced hypertension    Preterm labor    Vaginal Pap smear, abnormal     Patient Active Problem List   Diagnosis Date Noted   Positive for microalbuminuria 04/16/2023   IUD (intrauterine device) in place 04/15/2023   Primary hypertension 03/24/2023   Leukocytosis 11/14/2022   Increased storage iron 11/14/2022   Atypical squamous cells of undetermined significance on cytologic smear of cervix (ASC-US ) 11/05/2022   Anemia 11/05/2022   Class 1 obesity due to excess calories with serious  comorbidity and body mass index (BMI) of 33.0 to 33.9 in adult 11/05/2022   Menorrhagia with regular cycle 11/05/2022   History of gestational diabetes mellitus 12/28/2015   Anxiety 12/13/2011    Past Surgical History:  Procedure Laterality Date   CERVICAL CONIZATION W/BX  12/13/2011   Procedure: CONIZATION CERVIX WITH BIOPSY;  Surgeon: Shanda SHAUNNA Muscat, MD;  Location: [facility];  Service: Gynecology;  Laterality: N/A;  Cold Knife   CESAREAN SECTION N/A 07/03/2016   Procedure: CESAREAN SECTION;  Surgeon: Jerolyn Foil, MD;  Location: [facility];  Service: Obstetrics;  Laterality: N/A;   CHOLECYSTECTOMY  09/09/2002   COLONOSCOPY     TUBAL LIGATION  2017   UPPER GASTROINTESTINAL ENDOSCOPY     WISDOM TOOTH EXTRACTION      OB History     Gravida  3   Para  3   Term  2   Preterm  1   AB      Living  3      SAB      IAB      Ectopic      Multiple  0   Live Births  3            Home Medications    Prior to Admission medications   Medication Sig Start Date End Date Taking? Authorizing Provider  brompheniramine-pseudoephedrine-DM 30-2-10 MG/5ML syrup Take 10 mLs by mouth every 6 (six) hours as needed (cough and congestion). 06/21/24  Yes Thurman Sarver, Lucie, FNP  cetirizine-pseudoephedrine (ZYRTEC-D) 5-120 MG tablet Take 1 tablet by  mouth daily with breakfast for 10 days. 06/21/24 07/01/24 Yes Iola Lukes, FNP  Dextromethorphan-guaiFENesin  (MUCINEX  DM MAXIMUM STRENGTH) 60-1200 MG TB12 Take 1 tablet by mouth 2 (two) times daily. 06/21/24  Yes Iola Lukes, FNP  levonorgestrel  (MIRENA ) 20 MCG/DAY IUD 1 each by Intrauterine route once. Patient not taking: Reported on 06/21/2024    [provider]    Family History Family History  Problem Relation Age of Onset   Diabetes Mother        controlled   Healthy Father    Diabetes Maternal Grandfather    Hypertension Maternal Grandfather    Diabetes Maternal Aunt     Social History Social  History   Tobacco Use   Smoking status: Former    Current packs/day: 0.50    Average packs/day: 0.5 packs/day for 3.0 years (1.5 ttl pk-yrs)    Types: Cigarettes   Smokeless tobacco: Never   Tobacco comments:    quit 2005  Vaping Use   Vaping status: Never Used  Substance Use Topics   Alcohol use: No    Comment: social/ none during pregnacy   Drug use: No     Allergies   Sulfa antibiotics, Wellbutrin  [bupropion ], and Amoxicillin   Review of Systems Review of Systems  Constitutional:  Negative for chills and fever.  HENT:  Positive for congestion, postnasal drip (mostly at night), sneezing and sore throat (feels raw when coughing). Negative for rhinorrhea.   Respiratory:  Positive for cough. Negative for shortness of breath and wheezing.   Gastrointestinal:  Negative for diarrhea, nausea and vomiting.  Musculoskeletal:  Negative for myalgias.  Neurological:  Positive for headaches.  All other systems reviewed and are negative.    Physical Exam Triage Vital Signs ED Triage Vitals  Encounter Vitals Group     BP 06/21/24 0825 (!) 155/94     Girls Systolic BP Percentile --      Girls Diastolic BP Percentile --      Boys Systolic BP Percentile --      Boys Diastolic BP Percentile --      Pulse Rate 06/21/24 0825 100     Resp 06/21/24 0825 18     Temp 06/21/24 0825 97.9 F (36.6 C)     Temp Source 06/21/24 0825 Oral     SpO2 06/21/24 0825 97 %     Weight 06/21/24 0826 178 lb (80.7 kg)     Height 06/21/24 0826 5' 2 (1.575 m)     Head Circumference --      Peak Flow --      Pain Score 06/21/24 0826 0     Pain Loc --      Pain Education --      Exclude from Growth Chart --    No data found.  Updated Vital Signs BP (!) 155/94 (BP Location: Left Arm)   Pulse 100   Temp 97.9 F (36.6 C) (Oral)   Resp 18   Ht 5' 2 (1.575 m)   Wt 178 lb (80.7 kg)   LMP  (Exact Date)   SpO2 97%   BMI 32.56 kg/m   Visual Acuity Right Eye Distance:   Left Eye Distance:    Bilateral Distance:    Right Eye Near:   Left Eye Near:    Bilateral Near:     Physical Exam Vitals reviewed.  Constitutional:      General: She is awake. She is not in acute distress.    Appearance: Normal appearance. She is  well-developed. She is not ill-appearing, toxic-appearing or diaphoretic.  HENT:     Head: Normocephalic.     Right Ear: Tympanic membrane, ear canal and external ear normal. No drainage, swelling or tenderness. No middle ear effusion. Tympanic membrane is not erythematous.     Left Ear: Tympanic membrane, ear canal and external ear normal. No drainage, swelling or tenderness.  No middle ear effusion. Tympanic membrane is not erythematous.     Nose: Congestion present. No rhinorrhea.     Mouth/Throat:     Lips: Pink.     Mouth: Mucous membranes are moist.     Pharynx: Oropharynx is clear. Uvula midline. No pharyngeal swelling, oropharyngeal exudate, posterior oropharyngeal erythema or uvula swelling.     Tonsils: No tonsillar exudate or tonsillar abscesses.  Eyes:     General: Vision grossly intact.     Conjunctiva/sclera: Conjunctivae normal.  Cardiovascular:     Rate and Rhythm: Normal rate.     Heart sounds: Normal heart sounds.  Pulmonary:     Effort: Pulmonary effort is normal. No tachypnea or respiratory distress.     Breath sounds: Normal breath sounds and air entry.  Musculoskeletal:        General: Normal range of motion.     Cervical back: Full passive range of motion without pain, normal range of motion and neck supple.  Lymphadenopathy:     Cervical: No cervical adenopathy.  Skin:    General: Skin is warm and dry.  Neurological:     General: No focal deficit present.     Mental Status: She is alert and oriented to person, place, and time.  Psychiatric:        Behavior: Behavior is cooperative.      UC Treatments / Results  Labs (all labs ordered are listed, but only abnormal results are displayed) Labs Reviewed - No data to  display  EKG   Radiology No results found.  Procedures Procedures (including critical care time)  Medications Ordered in UC Medications - No data to display  Initial Impression / Assessment and Plan / UC Course  I have reviewed the triage vital signs and the nursing notes.  Pertinent labs & imaging results that were available during my care of the patient were reviewed by me and considered in my medical decision making (see chart for details).     The patient presents with symptoms consistent with a viral upper respiratory infection. Exam is reassuring and no evidence of bacterial infection or acute cardiopulmonary process is noted. Supportive care is recommended. Patient was advised to follow up with primary care if symptoms do not improve within one week or if new concerns arise. Instructions were given to seek emergency care if symptoms worsen, including shortness of breath, chest pain, persistent high fever, inability to tolerate fluids, or confusion.  Today's evaluation has revealed no signs of a dangerous process. Discussed diagnosis with patient and/or guardian. Patient and/or guardian aware of their diagnosis, possible red flag symptoms to watch out for and need for close follow up. Patient and/or guardian understands verbal and written discharge instructions. Patient and/or guardian comfortable with plan and disposition.  Patient and/or guardian has a clear mental status at this time, good insight into illness (after discussion and teaching) and has clear judgment to make decisions regarding their care  Documentation was completed with the aid of voice recognition software. Transcription may contain typographical errors.  Final Clinical Impressions(s) / UC Diagnoses   Final diagnoses:  Viral upper respiratory  tract infection     Discharge Instructions      Your symptoms are most likely due to a respiratory infection affecting your nose, throat, or lungs. These infections  are usually caused by a virus, which means antibiotics will not help since they only treat bacterial infections. Please take any medications prescribed to you as directed. Several over-the-counter medicines can make you more comfortable. Acetaminophen  (Tylenol ) or ibuprofen  (Advil , Motrin ) can help with fever, body aches, or sore throat pain. Nasal sprays like Flonase may relieve nasal congestion, while saline sprays or rinses can be used often to keep your nose clear. Sore throat discomfort may improve with lozenges, menthol  or benzocaine sprays, or warm saltwater gargles. These medicines do not cure the virus but can help you feel better as your body recovers. It is important to drink plenty of fluids to stay hydrated and help thin mucus. Aim for urine that is pale yellow. Using a cool mist humidifier at home, inhaling steam several times a day, and avoiding cool or dry air may also ease congestion. Sleeping with your head elevated can reduce post-nasal drainage, and getting enough rest each night supports recovery. Remember to replace your toothbrush once you start feeling better. A cough may last for several weeks after a respiratory illness even when other symptoms resolve, as the airways remain irritated. As long as the cough gradually improves and no new concerning symptoms appear, this is part of normal healing.  If your symptoms worsen or you develop new problems such as trouble breathing, chest pain, or high fever, go to the emergency room right away.           ED Prescriptions     Medication Sig Dispense Auth. Provider   cetirizine-pseudoephedrine (ZYRTEC-D) 5-120 MG tablet Take 1 tablet by mouth daily with breakfast for 10 days. 10 tablet Iola Lukes, FNP   Dextromethorphan-guaiFENesin  (MUCINEX  DM MAXIMUM STRENGTH) 60-1200 MG TB12 Take 1 tablet by mouth 2 (two) times daily. 20 tablet Iola Lukes, FNP   brompheniramine-pseudoephedrine-DM 30-2-10 MG/5ML syrup Take 10 mLs by  mouth every 6 (six) hours as needed (cough and congestion). 120 mL Iola Lukes, FNP      PDMP not reviewed this encounter.   Iola [place], [place] 06/21/24 [phone]

## 2024-06-21 NOTE — ED Triage Notes (Signed)
 Pt st's she had sneezing 5 days ago Then developed a cough yesterday  Today pt c/o productive cough (clear)

## 2024-07-30 ENCOUNTER — Ambulatory Visit: Admitting: Family

## 2024-07-30 ENCOUNTER — Encounter: Payer: Self-pay | Admitting: Family

## 2024-07-30 VITALS — BP 205/120 | HR 110 | Temp 98.6°F | Ht 62.5 in | Wt 220.0 lb

## 2024-07-30 DIAGNOSIS — I10 Essential (primary) hypertension: Secondary | ICD-10-CM

## 2024-07-30 DIAGNOSIS — Z0001 Encounter for general adult medical examination with abnormal findings: Secondary | ICD-10-CM

## 2024-07-30 DIAGNOSIS — R7303 Prediabetes: Secondary | ICD-10-CM | POA: Diagnosis not present

## 2024-07-30 DIAGNOSIS — R9431 Abnormal electrocardiogram [ECG] [EKG]: Secondary | ICD-10-CM | POA: Diagnosis not present

## 2024-07-30 DIAGNOSIS — R635 Abnormal weight gain: Secondary | ICD-10-CM

## 2024-07-30 DIAGNOSIS — E66812 Obesity, class 2: Secondary | ICD-10-CM | POA: Diagnosis not present

## 2024-07-30 DIAGNOSIS — R5383 Other fatigue: Secondary | ICD-10-CM

## 2024-07-30 DIAGNOSIS — Z6839 Body mass index (BMI) 39.0-39.9, adult: Secondary | ICD-10-CM

## 2024-07-30 DIAGNOSIS — R809 Proteinuria, unspecified: Secondary | ICD-10-CM | POA: Diagnosis not present

## 2024-07-30 DIAGNOSIS — Z0000 Encounter for general adult medical examination without abnormal findings: Secondary | ICD-10-CM

## 2024-07-30 DIAGNOSIS — Z975 Presence of (intrauterine) contraceptive device: Secondary | ICD-10-CM | POA: Diagnosis not present

## 2024-07-30 LAB — COMPREHENSIVE METABOLIC PANEL WITH GFR
ALT: 12 U/L (ref 0–35)
AST: 14 U/L (ref 0–37)
Albumin: 4 g/dL (ref 3.5–5.2)
Alkaline Phosphatase: 78 U/L (ref 39–117)
BUN: 12 mg/dL (ref 6–23)
CO2: 27 meq/L (ref 19–32)
Calcium: 8.7 mg/dL (ref 8.4–10.5)
Chloride: 106 meq/L (ref 96–112)
Creatinine, Ser: 0.6 mg/dL (ref 0.40–1.20)
GFR: 111.34 mL/min (ref >60.00)
Glucose, Bld: 156 mg/dL — ABNORMAL HIGH (ref 70–99)
Potassium: 3.7 meq/L (ref 3.5–5.1)
Sodium: 139 meq/L (ref 135–145)
Total Bilirubin: 0.4 mg/dL (ref 0.2–1.2)
Total Protein: 6.5 g/dL (ref 6.0–8.3)

## 2024-07-30 LAB — TROPONIN I (HIGH SENSITIVITY): High Sens Troponin I: 12 ng/L (ref 2–17)

## 2024-07-30 LAB — CBC
HCT: 44 % (ref 36.0–46.0)
Hemoglobin: 14.6 g/dL (ref 12.0–15.0)
MCHC: 33.1 g/dL (ref 30.0–36.0)
MCV: 94 fl (ref 78.0–100.0)
Platelets: 241 K/uL (ref 150.0–400.0)
RBC: 4.68 Mil/uL (ref 3.87–5.11)
RDW: 13 % (ref 11.5–15.5)
WBC: 9.3 K/uL (ref 4.0–10.5)

## 2024-07-30 LAB — MICROALBUMIN / CREATININE URINE RATIO
Creatinine,U: 109.5 mg/dL
Microalb Creat Ratio: 30.7 mg/g — ABNORMAL HIGH (ref 0.0–30.0)
Microalb, Ur: 3.4 mg/dL — ABNORMAL HIGH (ref 0.0–1.9)

## 2024-07-30 LAB — HEMOGLOBIN A1C: Hgb A1c MFr Bld: 6.1 % (ref 4.6–6.5)

## 2024-07-30 MED ORDER — AMLODIPINE BESYLATE 5 MG PO TABS: 5.0000 mg | ORAL_TABLET | Freq: Every day | ORAL | 1 refills | Status: DC

## 2024-07-30 MED ORDER — LOSARTAN POTASSIUM 50 MG PO TABS: 50.0000 mg | ORAL_TABLET | Freq: Every day | ORAL | 1 refills | Status: DC

## 2024-07-30 MED ORDER — LEVONORGESTREL 20 MCG/DAY IU IUD: 1.0000 | INTRAUTERINE_SYSTEM | Freq: Once | INTRAUTERINE | Status: AC

## 2024-07-30 NOTE — Progress Notes (Addendum)
 "  Subjective:  Patient ID: [name], female    DOB: 02/27/1981  Age: 43 y.o. MRN: [id number]  Patient Care Team: Corwin Antu, FNP as PCP - General (Family Medicine)   CC:  Chief Complaint  Patient presents with   Annual Exam    HPI [name] is a 43 y.o. female who presents today for an annual physical exam. She reports consuming a general diet. Gym/ health club routine includes 3-4 times a week mix of cardio and strength training. She generally feels well. She reports sleeping well. She does have additional problems to discuss today.   Vision:Within last year Dental:Receives regular dental care  Mammogram: 09/26/23 Last pap: 03/24/23  Pt is with acute concerns.   Discussed the use of AI scribe software for clinical note transcription with the patient, who gave verbal consent to proceed.  History of Present Illness [name] is a 43 year old female with hypertension who presents with concerns about weight gain and elevated blood pressure.  She has been experiencing weight gain despite regular exercise and dietary modifications. She exercises at the gym at least three times a week, focusing on cardio and weightlifting, and has reduced sugar and fried foods while increasing protein and seafood intake. Despite these efforts, her weight has remained stable or increased slightly. She uses the MyFitnessPal app to track her calorie intake, maintaining a 500-calorie deficit, but has not seen expected weight loss. Her weight was around 178 pounds in October, but she has since gained approximately 10 pounds, now weighing around 190-200 pounds. She questions the accuracy of a previous urgent care weight measurement of 220 pounds, as her home scale does not reflect this.  Her blood pressure readings at home have been elevated, with a recent reading of 120/90 mmHg. She has a family history of hypertension.  She feels drained and tired despite 8-10 hours of sleep per night, waking  at 3 AM for work and going to bed by 8:30 PM. She occasionally uses melatonin to aid sleep. No significant changes in thirst or urinary frequency, and she does not experience stress eating, although she sometimes consumes zero-sugar sodas when stressed at work.  She has a family history of diabetes, with her mother and other relatives affected, raising concerns about her risk of developing diabetes, especially with recent weight gain. She denies any personal or family history of thyroid cancer. She has had an IUD in place for two years, which she feels has helped manage bloating and water retention.  Discussed the use of AI scribe software for clinical note transcription with the patient, who gave verbal consent to proceed.  Wt Readings from Last 3 Encounters:  07/30/24 220 lb (99.8 kg)  06/21/24 178 lb (80.7 kg)  12/29/23 172 lb (78 kg)     History of Present Illness    Advanced Directives Patient does not have advanced directives   DEPRESSION SCREENING    07/30/2024   10:16 AM 04/15/2023   10:49 AM 02/21/2023    9:30 AM 11/05/2022    9:15 AM  PHQ 2/9 Scores  PHQ - 2 Score 0 0 0 0  PHQ- 9 Score 3 2  3  4       Data saved with a previous flowsheet row definition     ROS: Negative unless specifically indicated above in HPI.    Current Outpatient Medications:    amLODipine  (NORVASC ) 5 MG tablet, Take 1 tablet (5 mg total) by mouth daily., Disp: 30  tablet, Rfl: 1   losartan  (COZAAR ) 50 MG tablet, Take 1 tablet (50 mg total) by mouth daily., Disp: 30 tablet, Rfl: 1   brompheniramine-pseudoephedrine -DM 30-2-10 MG/5ML syrup, Take 10 mLs by mouth every 6 (six) hours as needed (cough and congestion)., Disp: 120 mL, Rfl: 0   Dextromethorphan-guaiFENesin  (MUCINEX  DM MAXIMUM STRENGTH) 60-1200 MG TB12, Take 1 tablet by mouth 2 (two) times daily., Disp: 20 tablet, Rfl: 0   levonorgestrel  (MIRENA ) 20 MCG/DAY IUD, 1 each by Intrauterine route once for 1 dose., Disp: , Rfl:     Objective:     BP (!) 205/120   Pulse (!) 110   Temp 98.6 F (37 C) (Temporal)   Ht 5' 2.5 (1.588 m)   Wt 220 lb (99.8 kg)   SpO2 96%   BMI 39.60 kg/m   BP Readings from Last 3 Encounters:  07/30/24 (!) 205/120  06/21/24 (!) 155/94  12/29/23 (!) 153/97      Physical Exam Constitutional:      General: She is not in acute distress.    Appearance: Normal appearance. She is normal weight. She is not ill-appearing.  HENT:     Head: Normocephalic.     Right Ear: Tympanic membrane normal.     Left Ear: Tympanic membrane normal.     Nose: Nose normal.     Mouth/Throat:     Mouth: Mucous membranes are moist.  Eyes:     Extraocular Movements: Extraocular movements intact.     Pupils: Pupils are equal, round, and reactive to light.  Cardiovascular:     Rate and Rhythm: Normal rate and regular rhythm.  Pulmonary:     Effort: Pulmonary effort is normal.     Breath sounds: Normal breath sounds.  Musculoskeletal:        General: Normal range of motion.     Cervical back: Normal range of motion.  Skin:    General: Skin is warm.     Capillary Refill: Capillary refill takes less than 2 seconds.  Neurological:     General: No focal deficit present.     Mental Status: She is alert.  Psychiatric:        Mood and Affect: Mood normal.        Behavior: Behavior normal.        Thought Content: Thought content normal.        Judgment: Judgment normal.       Results DIAGNOSTIC EKG: Normal sinus rhythm, heart rate 99 bpm, QT interval 358 ms, T wave inversion in AVF (07/30/2024)      Assessment & Plan:   Assessment and Plan Assessment & Plan Essential hypertension with proteinuria Hypertension is poorly controlled with current readings of 192/118 mmHg, posing a risk for stroke and kidney damage. Proteinuria indicates kidney stress. Previous amlodipine  was not adhered to. Family history of hypertension and kidney issues. The patient does not usually notice symptoms of headaches or blurry  vision. Stress and anxiety may contribute to elevated readings. - Restarted amlodipine  5 mg for blood pressure control. - Ordered urine microalbumin to assess kidney function. - Ordered EKG to evaluate cardiac status. - Prescribed losartan  to protect kidneys. - Advised home blood pressure monitoring with proper technique. - Scheduled follow-up appointment in a few weeks.  Abnormal weight gain and class 2 obesity Weight gain of approximately 15 pounds since last year. Despite regular exercise and dietary modifications, weight remains unchanged. Possible thyroid dysfunction considered. Family history of diabetes and obesity. No current pregnancy. Stress  eating and dietary habits discussed. - Ordered thyroid function tests to rule out thyroid dysfunction. - Continue current exercise and dietary regimen. - Monitor weight and dietary intake. -The beneficiary does not have any FDA labeled contraindications to the requested agent including pregnancy, lactation, h/o medullary thyroid cancer or multiple endocrine neoplasia type II.  Fatigue Persistent fatigue despite adequate sleep. Possible thyroid dysfunction considered. The patient reports a consistent sleep schedule but feels drained and tired despite sleeping 8-10 hours. - Ordered thyroid function tests to evaluate for thyroid dysfunction.  Prediabetes Family history of diabetes. Recent weight gain raises concern for progression to diabetes. The patient is at risk for developing diabetes but has no current symptoms. - Encouraged continued lifestyle modifications to prevent diabetes progression.  Presence of intrauterine contraceptive device IUD in place for two years with no complications. Previous Pap smear in July was negative for ASCUS. No current issues with IUD. - Continue current IUD use   Encounter annual wellness visit Patient Counseling(The following topics were reviewed):  Preventative care handout given to pt  Health maintenance  and immunizations reviewed. Please refer to Health maintenance section. Pt advised on safe sex, wearing seatbelts in car, and proper nutrition labwork ordered today for annual Dental health: Discussed importance of regular tooth brushing, flossing, and dental visits.       Follow-up: Return in about 1 month (around 08/29/2024) for f/u blood pressure.   Christina Patrick, FNP   "

## 2024-07-30 NOTE — Patient Instructions (Signed)
 I have sent an electronic order over to your preferred location for the following:   [x]   3D diagnostic mammogram  Please give this center a call to get scheduled at your convenience.   [x]   [facility]      [address] [place], [place]        [phone]         Make sure to wear two piece  clothing  No lotions powders or deodorants the day of the appointment Make sure to bring picture ID and insurance card.  Bring list of medications you are currently taking including any supplements.

## 2024-08-02 ENCOUNTER — Ambulatory Visit: Payer: Self-pay | Admitting: Family

## 2024-08-02 DIAGNOSIS — I10 Essential (primary) hypertension: Secondary | ICD-10-CM

## 2024-08-02 DIAGNOSIS — R809 Proteinuria, unspecified: Secondary | ICD-10-CM

## 2024-08-02 DIAGNOSIS — R7989 Other specified abnormal findings of blood chemistry: Secondary | ICD-10-CM

## 2024-08-02 LAB — TSH: TSH: 1.73 u[IU]/mL (ref 0.35–5.50)

## 2024-08-02 LAB — T3, FREE: T3, Free: 3.4 pg/mL (ref 2.3–4.2)

## 2024-08-02 LAB — VITAMIN B12: Vitamin B-12: 214 pg/mL (ref 211–911)

## 2024-08-02 LAB — T4, FREE: Free T4: 0.52 ng/dL — ABNORMAL LOW (ref 0.60–1.60)

## 2024-08-30 ENCOUNTER — Ambulatory Visit: Admitting: Family

## 2024-09-03 ENCOUNTER — Encounter

## 2024-09-03 DIAGNOSIS — Z1231 Encounter for screening mammogram for malignant neoplasm of breast: Secondary | ICD-10-CM

## 2024-09-13 ENCOUNTER — Ambulatory Visit: Admitting: Family

## 2024-09-14 ENCOUNTER — Encounter: Payer: Self-pay | Admitting: Family

## 2024-09-14 ENCOUNTER — Ambulatory Visit: Admitting: Family

## 2024-09-14 DIAGNOSIS — Z6839 Body mass index (BMI) 39.0-39.9, adult: Secondary | ICD-10-CM

## 2024-09-14 DIAGNOSIS — E66812 Obesity, class 2: Secondary | ICD-10-CM

## 2024-09-14 DIAGNOSIS — R7989 Other specified abnormal findings of blood chemistry: Secondary | ICD-10-CM

## 2024-09-14 DIAGNOSIS — I10 Essential (primary) hypertension: Secondary | ICD-10-CM | POA: Diagnosis not present

## 2024-09-14 DIAGNOSIS — R809 Proteinuria, unspecified: Secondary | ICD-10-CM

## 2024-09-14 DIAGNOSIS — R7303 Prediabetes: Secondary | ICD-10-CM

## 2024-09-14 LAB — IBC + FERRITIN
Ferritin: 16.5 ng/mL (ref 10.0–291.0)
Iron: 81 ug/dL (ref 42–145)
Saturation Ratios: 17.9 % — ABNORMAL LOW (ref 20.0–50.0)
TIBC: 452.2 ug/dL — ABNORMAL HIGH (ref 250.0–450.0)
Transferrin: 323 mg/dL (ref 212.0–360.0)

## 2024-09-14 LAB — CBC
HCT: 44.9 % (ref 36.0–46.0)
Hemoglobin: 15 g/dL (ref 12.0–15.0)
MCHC: 33.5 g/dL (ref 30.0–36.0)
MCV: 93.1 fl (ref 78.0–100.0)
Platelets: 224 K/uL (ref 150.0–400.0)
RBC: 4.82 Mil/uL (ref 3.87–5.11)
RDW: 13.1 % (ref 11.5–15.5)
WBC: 8.2 K/uL (ref 4.0–10.5)

## 2024-09-14 LAB — MICROALBUMIN / CREATININE URINE RATIO
Creatinine,U: 69 mg/dL
Microalb Creat Ratio: 16.9 mg/g (ref 0.0–30.0)
Microalb, Ur: 1.2 mg/dL (ref 0.7–1.9)

## 2024-09-14 LAB — T4, FREE: Free T4: 0.75 ng/dL (ref 0.60–1.60)

## 2024-09-14 LAB — TSH: TSH: 1.48 u[IU]/mL (ref 0.35–5.50)

## 2024-09-14 MED ORDER — PHENTERMINE-TOPIRAMATE ER 3.75-23 MG PO CP24: ORAL_CAPSULE | ORAL | 0 refills | Status: DC

## 2024-09-14 MED ORDER — PHENTERMINE-TOPIRAMATE ER 7.5-46 MG PO CP24: ORAL_CAPSULE | ORAL | 2 refills | Status: DC

## 2024-09-14 NOTE — Progress Notes (Signed)
 "  Established Patient Office Visit  Subjective:      CC:  Chief Complaint  Patient presents with   Hypertension    HPI: [name] is a 44 y.o. female presenting on 09/14/2024 for Hypertension .  Discussed the use of AI scribe software for clinical note transcription with the patient, who gave verbal consent to proceed.  History of Present Illness [name] is a 44 year old female with hypertension who presents for a follow-up on her blood pressure management.  Her blood pressure has been well-controlled recently, with home readings averaging 129-130/89-90 mmHg. Initially, during the first two weeks of treatment, there was not much change, but by the third week, her blood pressure decreased significantly. She is currently taking amlodipine  5 mg and losartan  50 mg daily and has no issues with ankle swelling or other side effects. Occasionally, she experiences lightheadedness in the mornings, which has occurred two or three times.  She recalls a previous issue with high iron levels, which was not re-evaluated since two years ago. She used to take iron supplements due to low iron levels during her menstrual cycle before getting an IUD, which has since improved her symptoms. She is currently taking B12 supplements.  She mentions fluctuations in her weight over the holidays, gaining and losing a few pounds without any significant changes in her routine. She has not experienced any abnormal weight gain or other symptoms related to her thyroid , although her 3T4 was slightly decreased with a normal TSH level.  She has a history of mild prediabetes with improvement noted from a year ago.  No chest pain, palpitations, or shortness of breath. No personal or family history of thyroid  cancer or neuroendocrine dysplasia. She denies any history of pancreatitis or severe constipation.  She has previously taken bupropion  but discontinued it due to feeling 'on edge' and experiencing side  effects. She has no history of seizures.  Lab Results  Component Value Date   WBC 9.3 07/30/2024   HGB 14.6 07/30/2024   HCT 44.0 07/30/2024   MCV 94.0 07/30/2024   PLT 241.0 07/30/2024           Social history:  Relevant past medical, surgical, family and social history reviewed and updated as indicated. Interim medical history since our last visit reviewed.  Allergies and medications reviewed and updated.  DATA REVIEWED: CHART IN EPIC     ROS: Negative unless specifically indicated above in HPI.   Current Medications[1]        Objective:        BP 119/81   Pulse (!) 105   Temp 98.7 F (37.1 C) (Temporal)   Ht 5' 2.5 (1.588 m)   Wt 222 lb 12.8 oz (101.1 kg)   SpO2 98%   BMI 40.10 kg/m   Physical Exam VITALS: BP- 119/81 EXTREMITIES: No ankle swelling.  Wt Readings from Last 3 Encounters:  09/14/24 222 lb 12.8 oz (101.1 kg)  07/30/24 220 lb (99.8 kg)  06/21/24 178 lb (80.7 kg)    Physical Exam       Results Labs Urine microalbumin (07/2024): Mildly elevated HbA1c: Improved compared to one year ago, consistent with mild prediabetes Total T4: Slightly decreased TSH: Within normal limits  Assessment & Plan:   Assessment and Plan Assessment & Plan Primary hypertension Blood pressure is well-controlled with current medications, amlodipine  5 mg and losartan  50 mg. Home readings average 129-130/89-90 mmHg. Occasional morning lightheadedness likely due to initial medication adjustment. No ankle swelling,  chest pain, or palpitations reported. - Continue amlodipine  5 mg and losartan  50 mg daily. - Encouraged hydration and regular blood pressure monitoring at home.  Class 2 severe obesity Discussed weight loss options. Georjean is available but not covered by Medicaid. Contrave is not suitable due to previous adverse effects with bupropion . Qsymia  is considered as an alternative, with caution due to potential blood pressure elevation. -  Attempted to obtain insurance coverage for Qsymia . - Monitor blood pressure closely if Qsymia  is initiated. - could also consider GLP 1 injection or pills  -The beneficiary does not have any FDA labeled contraindications to the requested agent including pregnancy, lactation, h/o medullary thyroid  cancer or multiple endocrine neoplasia type II.  the patient has attempted to lose weight by reducing caloric intake by about 30% and completed at least 150 minutes of exercise per week unless limited, and the patient has unfortunately not been able to achieve a 5% weight reduction with calorie deficit goals, exercise goals, and behavioral therapy for at least 6 months prior to drug therapy. Negative for history of seizures   Prediabetes Mild prediabetes with improvement from a year ago. Emphasis on dietary management. - Continue a Mediterranean-type diet.  Proteinuria Previous urine microalbumin was slightly elevated. Annual repeat testing is planned. - Ordered urine microalbumin test.  Disorder of iron metabolism Previous iron levels were slightly elevated. No current iron supplementation. Plan to reassess iron levels. - Ordered iron level test.  Low serum T4 TSH is normal. No symptoms of abnormal weight gain or other thyroid  dysfunction symptoms reported. - Ordered repeat thyroid  function tests.       Return in about 6 months (around 03/14/2025) for f/u blood pressure.     Ginger Patrick, MSN, APRN, FNP-C [facility]        [1]  Current Outpatient Medications:    amLODipine  (NORVASC ) 5 MG tablet, Take 1 tablet (5 mg total) by mouth daily., Disp: 30 tablet, Rfl: 1   levonorgestrel  (MIRENA ) 20 MCG/DAY IUD, 1 each by Intrauterine route once for 1 dose., Disp: , Rfl:    losartan  (COZAAR ) 50 MG tablet, Take 1 tablet (50 mg total) by mouth daily., Disp: 30 tablet, Rfl: 1  "

## 2024-09-15 ENCOUNTER — Telehealth: Payer: Self-pay

## 2024-09-15 ENCOUNTER — Other Ambulatory Visit (HOSPITAL_COMMUNITY): Payer: Self-pay

## 2024-09-15 NOTE — Telephone Encounter (Signed)
 Pharmacy Patient Advocate Encounter   Received notification from [facility] that prior authorization for Qsymia  7.5-46 is required/requested.   Insurance verification completed.   The patient is insured through [employer].   Per test claim: PA required and submitted KEY/EOC/Request #: [id number] from 09/15/24 to 09/15/25. Ran test claim, Copay is $4.00. This test claim was processed through [facility]- copay amounts may vary at other pharmacies due to pharmacy/plan contracts, or as the patient moves through the different stages of their insurance plan.

## 2024-09-20 ENCOUNTER — Other Ambulatory Visit: Payer: Self-pay | Admitting: Family

## 2024-09-20 ENCOUNTER — Ambulatory Visit: Payer: Self-pay | Admitting: Family

## 2024-09-20 DIAGNOSIS — Z6839 Body mass index (BMI) 39.0-39.9, adult: Secondary | ICD-10-CM

## 2024-09-20 DIAGNOSIS — I10 Essential (primary) hypertension: Secondary | ICD-10-CM

## 2024-09-20 DIAGNOSIS — R7303 Prediabetes: Secondary | ICD-10-CM

## 2024-09-20 DIAGNOSIS — R809 Proteinuria, unspecified: Secondary | ICD-10-CM

## 2024-09-28 MED ORDER — QSYMIA 3.75-23 MG PO CP24: ORAL_CAPSULE | ORAL | 0 refills | Status: AC

## 2024-09-28 MED ORDER — QSYMIA 7.5-46 MG PO CP24: ORAL_CAPSULE | ORAL | 2 refills | Status: AC

## 2025-03-15 ENCOUNTER — Ambulatory Visit: Admitting: Family
